# Patient Record
Sex: Female | Born: 1935 | Race: White | Hispanic: No | Smoking: Former smoker
Health system: Southern US, Community
[De-identification: ages and names within clinical notes are randomized; demographics above are authoritative.]

## PROBLEM LIST (undated history)

## (undated) DIAGNOSIS — I10 Essential (primary) hypertension: Secondary | ICD-10-CM

## (undated) DIAGNOSIS — J439 Emphysema, unspecified: Secondary | ICD-10-CM

## (undated) DIAGNOSIS — I714 Abdominal aortic aneurysm, without rupture, unspecified: Secondary | ICD-10-CM

## (undated) DIAGNOSIS — E785 Hyperlipidemia, unspecified: Secondary | ICD-10-CM

## (undated) DIAGNOSIS — E278 Other specified disorders of adrenal gland: Secondary | ICD-10-CM

## (undated) DIAGNOSIS — R0902 Hypoxemia: Secondary | ICD-10-CM

## (undated) DIAGNOSIS — J449 Chronic obstructive pulmonary disease, unspecified: Secondary | ICD-10-CM

## (undated) HISTORY — DX: Hyperlipidemia, unspecified: E78.5

## (undated) HISTORY — DX: Essential (primary) hypertension: I10

## (undated) HISTORY — PX: BLADDER SURGERY: SHX569

## (undated) HISTORY — DX: Other specified disorders of adrenal gland: E27.8

## (undated) HISTORY — DX: Hypoxemia: R09.02

## (undated) HISTORY — DX: Chronic obstructive pulmonary disease, unspecified: J44.9

## (undated) HISTORY — DX: Emphysema, unspecified: J43.9

## (undated) HISTORY — DX: Abdominal aortic aneurysm, without rupture, unspecified: I71.40

## (undated) HISTORY — DX: Abdominal aortic aneurysm, without rupture: I71.4

## (undated) HISTORY — PX: BLADDER TUMOR EXCISION: SHX238

---

## 2004-02-24 ENCOUNTER — Other Ambulatory Visit: Payer: Self-pay

## 2004-03-03 ENCOUNTER — Ambulatory Visit: Payer: Self-pay | Admitting: Urology

## 2004-03-06 ENCOUNTER — Ambulatory Visit: Payer: Self-pay | Admitting: Urology

## 2004-03-13 ENCOUNTER — Ambulatory Visit: Payer: Self-pay | Admitting: Urology

## 2004-03-18 ENCOUNTER — Ambulatory Visit: Payer: Self-pay | Admitting: Urology

## 2007-08-03 ENCOUNTER — Ambulatory Visit: Payer: Self-pay | Admitting: Family Medicine

## 2009-06-13 ENCOUNTER — Ambulatory Visit: Payer: Self-pay | Admitting: Family Medicine

## 2011-08-02 DIAGNOSIS — J40 Bronchitis, not specified as acute or chronic: Secondary | ICD-10-CM | POA: Diagnosis not present

## 2011-08-02 DIAGNOSIS — J449 Chronic obstructive pulmonary disease, unspecified: Secondary | ICD-10-CM | POA: Diagnosis not present

## 2011-11-30 ENCOUNTER — Observation Stay: Payer: Self-pay | Admitting: Internal Medicine

## 2011-11-30 DIAGNOSIS — Z79899 Other long term (current) drug therapy: Secondary | ICD-10-CM | POA: Diagnosis not present

## 2011-11-30 DIAGNOSIS — D72829 Elevated white blood cell count, unspecified: Secondary | ICD-10-CM | POA: Diagnosis not present

## 2011-11-30 DIAGNOSIS — I10 Essential (primary) hypertension: Secondary | ICD-10-CM | POA: Diagnosis not present

## 2011-11-30 DIAGNOSIS — Z7982 Long term (current) use of aspirin: Secondary | ICD-10-CM | POA: Diagnosis not present

## 2011-11-30 DIAGNOSIS — R0902 Hypoxemia: Secondary | ICD-10-CM | POA: Diagnosis not present

## 2011-11-30 DIAGNOSIS — Z91013 Allergy to seafood: Secondary | ICD-10-CM | POA: Diagnosis not present

## 2011-11-30 DIAGNOSIS — E669 Obesity, unspecified: Secondary | ICD-10-CM | POA: Diagnosis not present

## 2011-11-30 DIAGNOSIS — K8020 Calculus of gallbladder without cholecystitis without obstruction: Secondary | ICD-10-CM | POA: Diagnosis not present

## 2011-11-30 DIAGNOSIS — R42 Dizziness and giddiness: Secondary | ICD-10-CM | POA: Diagnosis not present

## 2011-11-30 DIAGNOSIS — Z880 Allergy status to penicillin: Secondary | ICD-10-CM | POA: Diagnosis not present

## 2011-11-30 DIAGNOSIS — E785 Hyperlipidemia, unspecified: Secondary | ICD-10-CM | POA: Diagnosis not present

## 2011-11-30 DIAGNOSIS — J449 Chronic obstructive pulmonary disease, unspecified: Secondary | ICD-10-CM | POA: Diagnosis not present

## 2011-11-30 DIAGNOSIS — R0989 Other specified symptoms and signs involving the circulatory and respiratory systems: Secondary | ICD-10-CM | POA: Diagnosis not present

## 2011-11-30 DIAGNOSIS — I658 Occlusion and stenosis of other precerebral arteries: Secondary | ICD-10-CM | POA: Diagnosis not present

## 2011-11-30 DIAGNOSIS — F17200 Nicotine dependence, unspecified, uncomplicated: Secondary | ICD-10-CM | POA: Diagnosis not present

## 2011-11-30 DIAGNOSIS — R6889 Other general symptoms and signs: Secondary | ICD-10-CM | POA: Diagnosis not present

## 2011-11-30 DIAGNOSIS — R05 Cough: Secondary | ICD-10-CM | POA: Diagnosis not present

## 2011-11-30 LAB — URINALYSIS, COMPLETE
Blood: NEGATIVE
Glucose,UR: NEGATIVE mg/dL (ref 0–75)
Hyaline Cast: 1
RBC,UR: 1 /HPF (ref 0–5)
Specific Gravity: 1.025 (ref 1.003–1.030)
Squamous Epithelial: <1
WBC UR: 1 /HPF (ref 0–5)

## 2011-11-30 LAB — CBC
HCT: 48.2 % — ABNORMAL HIGH (ref 35.0–47.0)
HGB: 15.7 g/dL (ref 12.0–16.0)
MCH: 29.9 pg (ref 26.0–34.0)
MCHC: 32.5 g/dL (ref 32.0–36.0)
MCV: 92 fL (ref 80–100)
RDW: 13.3 % (ref 11.5–14.5)

## 2011-11-30 LAB — COMPREHENSIVE METABOLIC PANEL
Alkaline Phosphatase: 113 U/L (ref 50–136)
BUN: 15 mg/dL (ref 7–18)
Bilirubin,Total: 0.4 mg/dL (ref 0.2–1.0)
Calcium, Total: 9 mg/dL (ref 8.5–10.1)
Chloride: 101 mmol/L (ref 98–107)
Co2: 27 mmol/L (ref 21–32)
EGFR (African American): >60
EGFR (Non-African Amer.): >60
Glucose: 145 mg/dL — ABNORMAL HIGH (ref 65–99)
SGOT(AST): 27 U/L (ref 15–37)
Sodium: 138 mmol/L (ref 136–145)

## 2011-11-30 LAB — TROPONIN I: Troponin-I: <0.02 ng/mL

## 2011-11-30 LAB — PRO B NATRIURETIC PEPTIDE: B-Type Natriuretic Peptide: 120 pg/mL (ref 0–450)

## 2011-11-30 LAB — CK TOTAL AND CKMB (NOT AT ARMC): CK, Total: 67 U/L (ref 21–215)

## 2011-12-01 DIAGNOSIS — I6529 Occlusion and stenosis of unspecified carotid artery: Secondary | ICD-10-CM | POA: Diagnosis not present

## 2011-12-01 DIAGNOSIS — I10 Essential (primary) hypertension: Secondary | ICD-10-CM | POA: Diagnosis not present

## 2011-12-01 DIAGNOSIS — J449 Chronic obstructive pulmonary disease, unspecified: Secondary | ICD-10-CM | POA: Diagnosis not present

## 2011-12-01 DIAGNOSIS — R42 Dizziness and giddiness: Secondary | ICD-10-CM | POA: Diagnosis not present

## 2011-12-01 DIAGNOSIS — I658 Occlusion and stenosis of other precerebral arteries: Secondary | ICD-10-CM | POA: Diagnosis not present

## 2011-12-01 DIAGNOSIS — E785 Hyperlipidemia, unspecified: Secondary | ICD-10-CM | POA: Diagnosis not present

## 2011-12-01 LAB — CBC WITH DIFFERENTIAL/PLATELET
Basophil #: 0 10*3/uL (ref 0.0–0.1)
Basophil %: 0.2 %
HCT: 44.2 % (ref 35.0–47.0)
HGB: 14.5 g/dL (ref 12.0–16.0)
Lymphocyte %: 7 %
Monocyte %: 0.6 %
Neutrophil #: 14.2 10*3/uL — ABNORMAL HIGH (ref 1.4–6.5)
Neutrophil %: 91.6 %
RBC: 4.78 10*6/uL (ref 3.80–5.20)
RDW: 13.3 % (ref 11.5–14.5)
WBC: 15.5 10*3/uL — ABNORMAL HIGH (ref 3.6–11.0)

## 2011-12-01 LAB — LIPID PANEL
Cholesterol: 225 mg/dL — ABNORMAL HIGH (ref 0–200)
HDL Cholesterol: 29 mg/dL — ABNORMAL LOW (ref 40–60)
Triglycerides: 230 mg/dL — ABNORMAL HIGH (ref 0–200)
VLDL Cholesterol, Calc: 46 mg/dL — ABNORMAL HIGH (ref 5–40)

## 2011-12-07 DIAGNOSIS — E119 Type 2 diabetes mellitus without complications: Secondary | ICD-10-CM | POA: Diagnosis not present

## 2011-12-07 DIAGNOSIS — M79609 Pain in unspecified limb: Secondary | ICD-10-CM | POA: Diagnosis not present

## 2011-12-07 DIAGNOSIS — I70219 Atherosclerosis of native arteries of extremities with intermittent claudication, unspecified extremity: Secondary | ICD-10-CM | POA: Diagnosis not present

## 2011-12-07 DIAGNOSIS — I6529 Occlusion and stenosis of unspecified carotid artery: Secondary | ICD-10-CM | POA: Diagnosis not present

## 2011-12-09 DIAGNOSIS — J449 Chronic obstructive pulmonary disease, unspecified: Secondary | ICD-10-CM | POA: Diagnosis not present

## 2011-12-09 DIAGNOSIS — I10 Essential (primary) hypertension: Secondary | ICD-10-CM | POA: Diagnosis not present

## 2011-12-09 DIAGNOSIS — E785 Hyperlipidemia, unspecified: Secondary | ICD-10-CM | POA: Diagnosis not present

## 2012-02-23 DIAGNOSIS — E785 Hyperlipidemia, unspecified: Secondary | ICD-10-CM | POA: Diagnosis not present

## 2012-03-10 DIAGNOSIS — R209 Unspecified disturbances of skin sensation: Secondary | ICD-10-CM | POA: Diagnosis not present

## 2012-03-10 DIAGNOSIS — I6529 Occlusion and stenosis of unspecified carotid artery: Secondary | ICD-10-CM | POA: Diagnosis not present

## 2012-03-10 DIAGNOSIS — E669 Obesity, unspecified: Secondary | ICD-10-CM | POA: Diagnosis not present

## 2012-03-10 DIAGNOSIS — I10 Essential (primary) hypertension: Secondary | ICD-10-CM | POA: Diagnosis not present

## 2012-03-10 DIAGNOSIS — M79609 Pain in unspecified limb: Secondary | ICD-10-CM | POA: Diagnosis not present

## 2012-06-12 DIAGNOSIS — R42 Dizziness and giddiness: Secondary | ICD-10-CM | POA: Diagnosis not present

## 2012-06-12 DIAGNOSIS — I10 Essential (primary) hypertension: Secondary | ICD-10-CM | POA: Diagnosis not present

## 2012-06-12 DIAGNOSIS — E785 Hyperlipidemia, unspecified: Secondary | ICD-10-CM | POA: Diagnosis not present

## 2012-06-12 DIAGNOSIS — J449 Chronic obstructive pulmonary disease, unspecified: Secondary | ICD-10-CM | POA: Diagnosis not present

## 2012-08-29 DIAGNOSIS — L905 Scar conditions and fibrosis of skin: Secondary | ICD-10-CM | POA: Diagnosis not present

## 2012-08-29 DIAGNOSIS — Z1283 Encounter for screening for malignant neoplasm of skin: Secondary | ICD-10-CM | POA: Diagnosis not present

## 2012-08-29 DIAGNOSIS — D485 Neoplasm of uncertain behavior of skin: Secondary | ICD-10-CM | POA: Diagnosis not present

## 2012-08-29 DIAGNOSIS — L821 Other seborrheic keratosis: Secondary | ICD-10-CM | POA: Diagnosis not present

## 2012-08-29 DIAGNOSIS — L719 Rosacea, unspecified: Secondary | ICD-10-CM | POA: Diagnosis not present

## 2012-08-29 DIAGNOSIS — C44319 Basal cell carcinoma of skin of other parts of face: Secondary | ICD-10-CM | POA: Diagnosis not present

## 2012-09-13 DIAGNOSIS — C44319 Basal cell carcinoma of skin of other parts of face: Secondary | ICD-10-CM | POA: Diagnosis not present

## 2012-11-27 DIAGNOSIS — Z85828 Personal history of other malignant neoplasm of skin: Secondary | ICD-10-CM | POA: Diagnosis not present

## 2012-11-27 DIAGNOSIS — L905 Scar conditions and fibrosis of skin: Secondary | ICD-10-CM | POA: Diagnosis not present

## 2013-01-09 DIAGNOSIS — J3089 Other allergic rhinitis: Secondary | ICD-10-CM | POA: Diagnosis not present

## 2013-01-09 DIAGNOSIS — I10 Essential (primary) hypertension: Secondary | ICD-10-CM | POA: Diagnosis not present

## 2013-01-09 DIAGNOSIS — E785 Hyperlipidemia, unspecified: Secondary | ICD-10-CM | POA: Diagnosis not present

## 2013-01-09 DIAGNOSIS — J449 Chronic obstructive pulmonary disease, unspecified: Secondary | ICD-10-CM | POA: Diagnosis not present

## 2013-08-29 DIAGNOSIS — L738 Other specified follicular disorders: Secondary | ICD-10-CM | POA: Diagnosis not present

## 2013-08-29 DIAGNOSIS — D239 Other benign neoplasm of skin, unspecified: Secondary | ICD-10-CM | POA: Diagnosis not present

## 2013-08-29 DIAGNOSIS — Z1283 Encounter for screening for malignant neoplasm of skin: Secondary | ICD-10-CM | POA: Diagnosis not present

## 2013-08-29 DIAGNOSIS — Z85828 Personal history of other malignant neoplasm of skin: Secondary | ICD-10-CM | POA: Diagnosis not present

## 2013-09-10 DIAGNOSIS — J449 Chronic obstructive pulmonary disease, unspecified: Secondary | ICD-10-CM | POA: Diagnosis not present

## 2013-09-10 DIAGNOSIS — I10 Essential (primary) hypertension: Secondary | ICD-10-CM | POA: Diagnosis not present

## 2013-09-10 DIAGNOSIS — E785 Hyperlipidemia, unspecified: Secondary | ICD-10-CM | POA: Diagnosis not present

## 2014-06-12 DIAGNOSIS — E784 Other hyperlipidemia: Secondary | ICD-10-CM | POA: Diagnosis not present

## 2014-06-12 DIAGNOSIS — J449 Chronic obstructive pulmonary disease, unspecified: Secondary | ICD-10-CM | POA: Diagnosis not present

## 2014-06-12 DIAGNOSIS — I10 Essential (primary) hypertension: Secondary | ICD-10-CM | POA: Diagnosis not present

## 2014-06-12 DIAGNOSIS — R69 Illness, unspecified: Secondary | ICD-10-CM | POA: Diagnosis not present

## 2014-09-11 DIAGNOSIS — Z1283 Encounter for screening for malignant neoplasm of skin: Secondary | ICD-10-CM | POA: Diagnosis not present

## 2014-09-11 DIAGNOSIS — L821 Other seborrheic keratosis: Secondary | ICD-10-CM | POA: Diagnosis not present

## 2014-09-11 DIAGNOSIS — Z08 Encounter for follow-up examination after completed treatment for malignant neoplasm: Secondary | ICD-10-CM | POA: Diagnosis not present

## 2014-09-11 DIAGNOSIS — Z85828 Personal history of other malignant neoplasm of skin: Secondary | ICD-10-CM | POA: Diagnosis not present

## 2014-09-22 NOTE — Discharge Summary (Signed)
PATIENT NAME:  LOY, LITTLE MR#:  [id number] DATE OF BIRTH:  06/15/1930  DATE OF ADMISSION:  11/30/2011 DATE OF DISCHARGE:  12/01/2011  ADMITTING DIAGNOSIS: Dizziness/vertigo.  DISCHARGE DIAGNOSES:  1. Vertigo.  2. Hypertension.  3. Hyperlipidemia.  4. Right carotid stenosis. 5. Chronic respiratory failure secondary to chronic obstructive pulmonary disease with ongoing tobacco abuse.  6. Incidental finding of cholelithiasis.  CODE STATUS: FULL CODE.   DISCHARGE MEDICATIONS:  1. Hydrochlorothiazide/losartan 12.5/50 mg one p.o. daily.  2. Spiriva 18 mcg inhalation daily.  3. Aspirin 81 mg daily.  4. Loratadine 10 mg daily.  5. Meclizine 25 mg three times daily as needed.   DISCHARGE INSTRUCTIONS/FOLLOWUP: Followup with Dr. Lucky Cowboy on 12/07/2011 at 8:45. Followup with Dr. Otilio Miu in 1 to 2 weeks.  LABORATORY, DIAGNOSTIC AND RADIOLOGIC DATA: Carotid Doppler showed calcified plaque bilaterally and somewhat more visible on the right, at the carotid bulb, than on the left. The wave flow patterns, velocity, and ratios do not suggest high grade stenosis, but visually on the right the degree of stenosis could be as much as 75%. Follow up with CT angiography would be useful.   Hemoglobin A1c 6.1. Cholesterol 225, triglycerides 230, LDL 150, and HDL 29. White count 15.5, hemoglobin and hematocrit 14.5 and 44.5, and platelet count 283.   CT of the head without contrast shows no acute intracranial process.   CT of the chest shows there is left basilar air space disease which may represent atelectasis. Left adenoma. Cholelithiasis.  Urinalysis negative for urinary tract infection.   Comprehensive metabolic panel within normal limits. Cardiac enzymes negative. B-type natriuretic peptide 120.  BRIEF SUMMARY OF HOSPITAL COURSE: Ms. Babineau is a 81 year old morbidly obese Caucasian female with the above medical problems who comes to the Emergency Room with:  1. Acute on chronic vertigo. The  patient had been to the beauty shop and after her hair was done, when the patient's hairdresser turned the patient's head around and she was spinning around, which aggravated her chronic vertigo. She felt nauseated, had vomiting x1, and came to the Emergency Room. She was not able to get her balance straightened out at home. She was given meclizine around-the-clock, symptomatically much better. Physical therapy evaluated the patient and no recommends at home.  2. Leukocytosis, appears reactive. No source of infection.  3. Chronic obstructive pulmonary disease. Appears chronic with chronic hypoxic respiratory failure. She remained stable. The patient has ongoing tobacco abuse. She is not motivated to quit smoking. Her saturations normally run in the 80s. The patient is aware of her saturations and so is her son. Since the patient was not motivated on giving up smoking, which she does on a regular basis, I was not comfortable for her to be set up with oxygen. The patient also did not want oxygen to go home with. 4. Hypertension, stable.  5. Hyperlipidemia. The patient has a known abnormal lipid profile for many years. Discussed starting p.o. statins, however, she wants to discuss it with her PCP.  6. Right carotid stenosis of about 75%. The patient is asymptomatic. This was noted in routine workup and on carotid Doppler. The patient will follow up with vascular as an outpatient, with Dr. Lucky Cowboy.  7. Incidental finding of cholelithiasis.         Overall the patient's hospital stay remained stable. The patient remained a FULL CODE.   TIME SPENT: 40 minutes.  ____________________________ Hart Rochester Posey Pronto, MD sap:slb D: 12/02/2011 14:36:45 ET T: 12/03/2011 14:14:51 ET JOB#:  [id number]  cc: Amairany Schumpert A. Posey Pronto, MD, <Dictator> Algernon Huxley, MD Juline Patch, MD Ilda Basset MD ELECTRONICALLY SIGNED 12/03/2011 17:13

## 2014-09-22 NOTE — H&P (Signed)
PATIENT NAME:  Brittany Parks, Brittany Parks MR#:  [id number] DATE OF BIRTH:  1930-07-06  DATE OF ADMISSION:  11/30/2011  PRIMARY CARE PHYSICIAN: Otilio Miu, MD    REFERRING PHYSICIAN: Dr. Prince Rome   CHIEF COMPLAINT: Dizziness today.   HISTORY OF PRESENT ILLNESS: The patient is a 81 year old Caucasian female with a history of hypertension, hyperlipidemia, smoking, and sinus problems who presented to the ED with the above chief complaint. The patient is awake, oriented in no acute distress. The patient said she developed severe dizziness today. She feels room is spinning around which is persistent, exacerbated by changing position on the bed. In addition, the patient had nausea, vomiting, mild shortness of breath and cough but she denies any chest pain, palpitation, orthopnea, or nocturnal dyspnea. No weakness, syncope, loss of consciousness but has mild headache. She was admitted for vertigo by Dr. Prince Rome.   PAST MEDICAL HISTORY: As mentioned above:  1. Hypertension. 2. Hyperlipidemia. 3. Smoking. 4. Sinus problem.   SOCIAL HISTORY: The patient has been smoking half pack a day for 60 years. No alcohol drinking or illicit drugs.   PAST SURGICAL HISTORY: None.   FAMILY HISTORY: No hypertension, diabetes, heart attack, or stroke.   MEDICATIONS:  1. Aspirin 81 mg p.o. daily.  2. Spiriva 18 mcg inhalation. 3. Percocet 5/325 mg p.o. q.6 hours p.r.n.  4. HCTZ/losartan 12.5/50 mg p.o. daily.   REVIEW OF SYSTEMS: CONSTITUTIONAL: The patient denies any fever, chills. No headache but has dizziness, mild weakness. EYES: No double vision or blurred vision. ENT: No epistaxis, postnasal drip, slurred speech, or dysphagia. CARDIOVASCULAR: No chest pain, palpitation, orthopnea, or nocturnal dyspnea. No leg edema. PULMONARY: Shortness of breath, cough but no wheezing or hemoptysis. GI: No abdominal pain but has nausea and vomiting. No diarrhea. No bloody stool or melena. GU: No dysuria, hematuria, or incontinence.  SKIN: No rash or jaundice. HEMATOLOGY: No easy bruising or bleeding. NEUROLOGY: No syncope, loss of consciousness, or seizure but has a headache and vertigo.   PHYSICAL EXAMINATION:   VITAL SIGNS: Temperature 96.3, blood pressure 147/68, pulse 91, respirations 20, oxygen saturation 97% on room air.   GENERAL: The patient is alert, awake, oriented in no acute distress.   HEENT: Pupils round, equal, reactive to light and accommodation. Moist oral mucosa. Clear oropharynx.   NECK: Supple. No JVD or carotid bruits. No lymphadenopathy. No thyromegaly.   CARDIOVASCULAR: S1, S2 regular rate and rhythm. No murmurs or gallops.   PULMONARY: The patient has very weak lung sounds bilaterally. Mild wheezing.    ABDOMEN: Soft. No distention or tenderness. No organomegaly. Bowel sounds present. Obese.   EXTREMITIES: No edema, clubbing, or cyanosis. No calf tenderness. Strong bilateral pedal pulses.    SKIN: No rash or jaundice.   NEUROLOGY: Alert and oriented x3. No focal deficit. Deep tendon reflexes 2+. Power 5 out of 5. Sensation intact.   LABORATORY, DIAGNOSTIC, AND RADIOLOGICAL DATA: CAT scan of the chest showed left basilar airspace disease, left adrenal adenoma, and cholelithiasis.   EKG showed normal sinus rhythm, possible left atrial enlargement, prolonged QT.   Urinalysis negative.  Chest x-ray is negative.  White count 14.1, hemoglobin 15.7, glucose 145, BUN 15, creatinine 0.79. Electrolytes normal. Troponin less than 0.02. BNP 120. CK 67.   IMPRESSION:  1. Vertigo, unknown etiology.  2. Leukocytosis possibly due to reaction.  3. Chronic obstructive pulmonary disease.  4. Hypertension, uncontrolled.  5. Hyperlipidemia.  6. Obesity.  7. Tobacco use. 8. Left adrenal adenoma. 9. Cholelithiasis.  PLAN OF TREATMENT:  1. The patient will be placed for observation.  2. We will check lipid panel, TSH, hemoglobin A1c.  3. Will get a CAT scan of head and a carotid duplex.  4. Will  give Antivert, aspirin, and Zocor.  5. Will continue HCTZ/losartan.  6. Will continue Spiriva and DuoNebs p.r.n.  7. Follow-up CBC.  8. Smoking cessation. Was counseled.   Discussed the patient's situation and plan of treatment with the patient and the patient's son.   TIME SPENT: About 60 minutes.   ____________________________ Demetrios Loll, MD qc:drc D: 11/30/2011 21:19:27 ET T: 12/01/2011 05:54:35 ET JOB#: [id number] cc: Demetrios Loll, MD, <Dictator>, Juline Patch, MD Demetrios Loll MD ELECTRONICALLY SIGNED 12/01/2011 14:40

## 2014-12-05 ENCOUNTER — Other Ambulatory Visit: Payer: Self-pay

## 2014-12-05 DIAGNOSIS — I10 Essential (primary) hypertension: Secondary | ICD-10-CM | POA: Insufficient documentation

## 2014-12-05 DIAGNOSIS — J441 Chronic obstructive pulmonary disease with (acute) exacerbation: Secondary | ICD-10-CM | POA: Insufficient documentation

## 2014-12-05 DIAGNOSIS — E785 Hyperlipidemia, unspecified: Secondary | ICD-10-CM | POA: Insufficient documentation

## 2014-12-05 MED ORDER — UMECLIDINIUM-VILANTEROL 62.5-25 MCG/INH IN AEPB: 1.0000 | INHALATION_SPRAY | Freq: Every day | RESPIRATORY_TRACT | Status: DC

## 2014-12-05 MED ORDER — LOSARTAN POTASSIUM-HCTZ 50-12.5 MG PO TABS
1.0000 | ORAL_TABLET | Freq: Every day | ORAL | Status: DC
Start: 2014-12-05 — End: 2015-02-21

## 2014-12-05 MED ORDER — ATORVASTATIN CALCIUM 10 MG PO TABS: 10.0000 mg | ORAL_TABLET | Freq: Every day | ORAL | Status: DC

## 2015-01-13 ENCOUNTER — Encounter: Payer: Self-pay | Admitting: Family Medicine

## 2015-01-13 ENCOUNTER — Ambulatory Visit (INDEPENDENT_AMBULATORY_CARE_PROVIDER_SITE_OTHER): Payer: Medicare Other | Admitting: Family Medicine

## 2015-01-13 VITALS — BP 130/78 | HR 64 | Ht 62.0 in | Wt 190.0 lb

## 2015-01-13 DIAGNOSIS — R310 Gross hematuria: Secondary | ICD-10-CM

## 2015-01-13 MED ORDER — SULFAMETHOXAZOLE-TRIMETHOPRIM 800-160 MG PO TABS: 1.0000 | ORAL_TABLET | Freq: Two times a day (BID) | ORAL | Status: DC

## 2015-01-13 NOTE — Progress Notes (Signed)
Name: Brittany Parks   MRN: [id number]    DOB: 07-Oct-1930   Date:01/13/2015       Progress Note  Subjective  Chief Complaint  Chief Complaint  Patient presents with  . Urinary Tract Infection    passing clots and lower back pain    Urinary Tract Infection  This is a recurrent (hematuria) problem. The current episode started in the past 7 days. The problem occurs every urination (hematuria). The problem has been waxing and waning. Associated symptoms include flank pain, frequency and hematuria. Pertinent negatives include no chills, hesitancy, nausea, urgency or vomiting.  Hematuria This is a recurrent problem. The current episode started in the past 7 days. The problem has been waxing and waning since onset. She describes the hematuria as gross hematuria. The hematuria occurs throughout her entire urinary stream. She reports clotting at the beginning of her urine stream. She is experiencing no pain. She describes her urine color as dark red (wanes to pink). Irritative symptoms include frequency. Irritative symptoms do not include nocturia or urgency. Obstructive symptoms include a weak stream. Obstructive symptoms do not include incomplete emptying, an intermittent stream, a slower stream or straining. Associated symptoms include flank pain and inability to urinate. Pertinent negatives include no abdominal pain, chills, dysuria, facial swelling, fever, genital pain, hesitancy, nausea or vomiting. Her past medical history is significant for tobacco use. There is no history of GU trauma.    No problem-specific assessment & plan notes found for this encounter.   Past Medical History  Diagnosis Date  . COPD (chronic obstructive pulmonary disease)   . Hyperlipidemia   . Hypertension     History reviewed. No pertinent past surgical history.  History reviewed. No pertinent family history.  Social History   Social History  . Marital Status: Widowed    Spouse Name: N/A  . Number of  Children: N/A  . Years of Education: N/A   Occupational History  . Not on file.   Social History Main Topics  . Smoking status: Former Research scientist (life sciences)  . Smokeless tobacco: Not on file  . Alcohol Use: No  . Drug Use: No  . Sexual Activity: No   Other Topics Concern  . Not on file   Social History Narrative  . No narrative on file    Allergies  Allergen Reactions  . Penicillins      Review of Systems  Constitutional: Negative for fever, chills, weight loss and malaise/fatigue.  HENT: Negative for ear discharge, ear pain, facial swelling and sore throat.   Eyes: Negative for blurred vision.  Respiratory: Negative for cough, sputum production, shortness of breath and wheezing.   Cardiovascular: Negative for chest pain, palpitations and leg swelling.  Gastrointestinal: Negative for heartburn, nausea, vomiting, abdominal pain, diarrhea, constipation, blood in stool and melena.  Genitourinary: Positive for frequency, hematuria and flank pain. Negative for dysuria, hesitancy, urgency, incomplete emptying and nocturia.  Musculoskeletal: Negative for myalgias, back pain, joint pain and neck pain.  Skin: Negative for rash.  Neurological: Negative for dizziness, tingling, sensory change, focal weakness and headaches.  Endo/Heme/Allergies: Negative for environmental allergies and polydipsia. Does not bruise/bleed easily.  Psychiatric/Behavioral: Negative for depression and suicidal ideas. The patient is not nervous/anxious and does not have insomnia.      Objective  Filed Vitals:   01/13/15 1554  BP: 130/78  Pulse: 64  Height: 5\' 2"  (1.575 m)  Weight: 190 lb (86.183 kg)    Physical Exam  Constitutional: She is well-developed, well-nourished, and  in no distress. No distress.  HENT:  Head: Normocephalic and atraumatic.  Right Ear: External ear normal.  Left Ear: External ear normal.  Nose: Nose normal.  Mouth/Throat: Oropharynx is clear and moist.  Eyes: Conjunctivae and EOM are  normal. Pupils are equal, round, and reactive to light. Right eye exhibits no discharge. Left eye exhibits no discharge.  Neck: Normal range of motion. Neck supple. No JVD present. No thyromegaly present.  Cardiovascular: Normal rate, regular rhythm, normal heart sounds and intact distal pulses.  Exam reveals no gallop and no friction rub.   No murmur heard. Pulmonary/Chest: Effort normal and breath sounds normal.  Abdominal: Soft. Bowel sounds are normal. She exhibits no mass. There is no tenderness. There is no guarding.  Musculoskeletal: Normal range of motion. She exhibits no edema.  Lymphadenopathy:    She has no cervical adenopathy.  Neurological: She is alert. She has normal reflexes.  Skin: Skin is warm and dry. She is not diaphoretic.  Psychiatric: Mood and affect normal.  Nursing note and vitals reviewed.     Assessment & Plan  Problem List Items Addressed This Visit    None        Dr. Otilio Miu [facility]  01/13/2015

## 2015-01-14 DIAGNOSIS — R310 Gross hematuria: Secondary | ICD-10-CM | POA: Diagnosis not present

## 2015-01-14 DIAGNOSIS — R39198 Other difficulties with micturition: Secondary | ICD-10-CM | POA: Insufficient documentation

## 2015-01-14 DIAGNOSIS — R3919 Other difficulties with micturition: Secondary | ICD-10-CM | POA: Diagnosis not present

## 2015-01-14 DIAGNOSIS — R339 Retention of urine, unspecified: Secondary | ICD-10-CM | POA: Diagnosis not present

## 2015-01-14 DIAGNOSIS — Z87442 Personal history of urinary calculi: Secondary | ICD-10-CM | POA: Diagnosis not present

## 2015-01-15 DIAGNOSIS — R339 Retention of urine, unspecified: Secondary | ICD-10-CM | POA: Diagnosis not present

## 2015-01-23 DIAGNOSIS — D175 Benign lipomatous neoplasm of intra-abdominal organs: Secondary | ICD-10-CM | POA: Diagnosis not present

## 2015-01-23 DIAGNOSIS — N3289 Other specified disorders of bladder: Secondary | ICD-10-CM | POA: Diagnosis not present

## 2015-01-23 DIAGNOSIS — K8020 Calculus of gallbladder without cholecystitis without obstruction: Secondary | ICD-10-CM | POA: Diagnosis not present

## 2015-01-23 DIAGNOSIS — E279 Disorder of adrenal gland, unspecified: Secondary | ICD-10-CM | POA: Diagnosis not present

## 2015-01-23 DIAGNOSIS — Z79899 Other long term (current) drug therapy: Secondary | ICD-10-CM | POA: Diagnosis not present

## 2015-01-23 DIAGNOSIS — Z87891 Personal history of nicotine dependence: Secondary | ICD-10-CM | POA: Diagnosis not present

## 2015-01-23 DIAGNOSIS — J449 Chronic obstructive pulmonary disease, unspecified: Secondary | ICD-10-CM | POA: Diagnosis not present

## 2015-01-23 DIAGNOSIS — I714 Abdominal aortic aneurysm, without rupture, unspecified: Secondary | ICD-10-CM | POA: Insufficient documentation

## 2015-01-23 DIAGNOSIS — Z7982 Long term (current) use of aspirin: Secondary | ICD-10-CM | POA: Diagnosis not present

## 2015-01-23 DIAGNOSIS — N200 Calculus of kidney: Secondary | ICD-10-CM | POA: Diagnosis not present

## 2015-01-23 DIAGNOSIS — N281 Cyst of kidney, acquired: Secondary | ICD-10-CM | POA: Diagnosis not present

## 2015-01-23 DIAGNOSIS — R934 Abnormal findings on diagnostic imaging of urinary organs: Secondary | ICD-10-CM | POA: Diagnosis not present

## 2015-01-23 DIAGNOSIS — M47819 Spondylosis without myelopathy or radiculopathy, site unspecified: Secondary | ICD-10-CM | POA: Diagnosis not present

## 2015-01-23 DIAGNOSIS — D3502 Benign neoplasm of left adrenal gland: Secondary | ICD-10-CM | POA: Diagnosis not present

## 2015-01-23 DIAGNOSIS — N858 Other specified noninflammatory disorders of uterus: Secondary | ICD-10-CM | POA: Diagnosis not present

## 2015-01-23 DIAGNOSIS — K5730 Diverticulosis of large intestine without perforation or abscess without bleeding: Secondary | ICD-10-CM | POA: Diagnosis not present

## 2015-01-23 DIAGNOSIS — D414 Neoplasm of uncertain behavior of bladder: Secondary | ICD-10-CM | POA: Diagnosis not present

## 2015-01-23 DIAGNOSIS — E278 Other specified disorders of adrenal gland: Secondary | ICD-10-CM | POA: Insufficient documentation

## 2015-01-23 DIAGNOSIS — R599 Enlarged lymph nodes, unspecified: Secondary | ICD-10-CM | POA: Diagnosis not present

## 2015-01-23 DIAGNOSIS — R590 Localized enlarged lymph nodes: Secondary | ICD-10-CM | POA: Insufficient documentation

## 2015-01-23 DIAGNOSIS — N261 Atrophy of kidney (terminal): Secondary | ICD-10-CM | POA: Diagnosis not present

## 2015-01-23 DIAGNOSIS — K868 Other specified diseases of pancreas: Secondary | ICD-10-CM | POA: Diagnosis not present

## 2015-01-23 DIAGNOSIS — R93429 Abnormal radiologic findings on diagnostic imaging of unspecified kidney: Secondary | ICD-10-CM | POA: Insufficient documentation

## 2015-01-23 DIAGNOSIS — Z880 Allergy status to penicillin: Secondary | ICD-10-CM | POA: Diagnosis not present

## 2015-01-23 DIAGNOSIS — R310 Gross hematuria: Secondary | ICD-10-CM | POA: Diagnosis not present

## 2015-01-24 DIAGNOSIS — C679 Malignant neoplasm of bladder, unspecified: Secondary | ICD-10-CM | POA: Diagnosis not present

## 2015-01-24 DIAGNOSIS — E785 Hyperlipidemia, unspecified: Secondary | ICD-10-CM | POA: Diagnosis not present

## 2015-01-24 DIAGNOSIS — I10 Essential (primary) hypertension: Secondary | ICD-10-CM | POA: Diagnosis not present

## 2015-01-24 DIAGNOSIS — R934 Abnormal findings on diagnostic imaging of urinary organs: Secondary | ICD-10-CM | POA: Diagnosis not present

## 2015-01-24 DIAGNOSIS — Z01818 Encounter for other preprocedural examination: Secondary | ICD-10-CM | POA: Diagnosis not present

## 2015-01-24 DIAGNOSIS — D414 Neoplasm of uncertain behavior of bladder: Secondary | ICD-10-CM | POA: Diagnosis not present

## 2015-01-24 DIAGNOSIS — R310 Gross hematuria: Secondary | ICD-10-CM | POA: Diagnosis not present

## 2015-01-24 DIAGNOSIS — J449 Chronic obstructive pulmonary disease, unspecified: Secondary | ICD-10-CM | POA: Diagnosis not present

## 2015-01-24 DIAGNOSIS — D489 Neoplasm of uncertain behavior, unspecified: Secondary | ICD-10-CM | POA: Diagnosis not present

## 2015-01-27 DIAGNOSIS — R310 Gross hematuria: Secondary | ICD-10-CM | POA: Diagnosis not present

## 2015-01-27 DIAGNOSIS — K5790 Diverticulosis of intestine, part unspecified, without perforation or abscess without bleeding: Secondary | ICD-10-CM | POA: Diagnosis not present

## 2015-01-27 DIAGNOSIS — E785 Hyperlipidemia, unspecified: Secondary | ICD-10-CM | POA: Diagnosis not present

## 2015-01-27 DIAGNOSIS — N281 Cyst of kidney, acquired: Secondary | ICD-10-CM | POA: Diagnosis not present

## 2015-01-27 DIAGNOSIS — D090 Carcinoma in situ of bladder: Secondary | ICD-10-CM | POA: Diagnosis not present

## 2015-01-27 DIAGNOSIS — D494 Neoplasm of unspecified behavior of bladder: Secondary | ICD-10-CM | POA: Diagnosis not present

## 2015-01-27 DIAGNOSIS — E278 Other specified disorders of adrenal gland: Secondary | ICD-10-CM | POA: Diagnosis not present

## 2015-01-27 DIAGNOSIS — Z880 Allergy status to penicillin: Secondary | ICD-10-CM | POA: Diagnosis not present

## 2015-01-27 DIAGNOSIS — C801 Malignant (primary) neoplasm, unspecified: Secondary | ICD-10-CM | POA: Diagnosis not present

## 2015-01-27 DIAGNOSIS — I714 Abdominal aortic aneurysm, without rupture: Secondary | ICD-10-CM | POA: Diagnosis not present

## 2015-01-27 DIAGNOSIS — Z7982 Long term (current) use of aspirin: Secondary | ICD-10-CM | POA: Diagnosis not present

## 2015-01-27 DIAGNOSIS — Z87891 Personal history of nicotine dependence: Secondary | ICD-10-CM | POA: Diagnosis not present

## 2015-01-27 DIAGNOSIS — I10 Essential (primary) hypertension: Secondary | ICD-10-CM | POA: Diagnosis not present

## 2015-01-27 DIAGNOSIS — Z87442 Personal history of urinary calculi: Secondary | ICD-10-CM | POA: Diagnosis not present

## 2015-01-27 DIAGNOSIS — K8020 Calculus of gallbladder without cholecystitis without obstruction: Secondary | ICD-10-CM | POA: Diagnosis not present

## 2015-01-27 DIAGNOSIS — Z79899 Other long term (current) drug therapy: Secondary | ICD-10-CM | POA: Diagnosis not present

## 2015-01-27 DIAGNOSIS — R935 Abnormal findings on diagnostic imaging of other abdominal regions, including retroperitoneum: Secondary | ICD-10-CM | POA: Diagnosis not present

## 2015-01-27 DIAGNOSIS — N200 Calculus of kidney: Secondary | ICD-10-CM | POA: Diagnosis not present

## 2015-01-27 DIAGNOSIS — J449 Chronic obstructive pulmonary disease, unspecified: Secondary | ICD-10-CM | POA: Diagnosis not present

## 2015-02-05 DIAGNOSIS — C673 Malignant neoplasm of anterior wall of bladder: Secondary | ICD-10-CM | POA: Diagnosis not present

## 2015-02-05 DIAGNOSIS — C678 Malignant neoplasm of overlapping sites of bladder: Secondary | ICD-10-CM | POA: Insufficient documentation

## 2015-02-05 DIAGNOSIS — R339 Retention of urine, unspecified: Secondary | ICD-10-CM | POA: Diagnosis not present

## 2015-02-18 DIAGNOSIS — C673 Malignant neoplasm of anterior wall of bladder: Secondary | ICD-10-CM | POA: Diagnosis not present

## 2015-02-18 DIAGNOSIS — T191XXA Foreign body in bladder, initial encounter: Secondary | ICD-10-CM | POA: Diagnosis not present

## 2015-02-21 ENCOUNTER — Other Ambulatory Visit: Payer: Self-pay

## 2015-02-21 DIAGNOSIS — E785 Hyperlipidemia, unspecified: Secondary | ICD-10-CM

## 2015-02-21 DIAGNOSIS — I10 Essential (primary) hypertension: Secondary | ICD-10-CM

## 2015-02-21 MED ORDER — ATORVASTATIN CALCIUM 10 MG PO TABS: 10.0000 mg | ORAL_TABLET | Freq: Every day | ORAL | Status: DC

## 2015-02-21 MED ORDER — LOSARTAN POTASSIUM-HCTZ 50-12.5 MG PO TABS: 1.0000 | ORAL_TABLET | Freq: Every day | ORAL | Status: DC

## 2015-03-05 DIAGNOSIS — C673 Malignant neoplasm of anterior wall of bladder: Secondary | ICD-10-CM | POA: Diagnosis not present

## 2015-03-05 DIAGNOSIS — N390 Urinary tract infection, site not specified: Secondary | ICD-10-CM | POA: Diagnosis not present

## 2015-03-05 DIAGNOSIS — R8281 Pyuria: Secondary | ICD-10-CM | POA: Insufficient documentation

## 2015-03-12 DIAGNOSIS — N3000 Acute cystitis without hematuria: Secondary | ICD-10-CM | POA: Diagnosis not present

## 2015-03-12 DIAGNOSIS — C673 Malignant neoplasm of anterior wall of bladder: Secondary | ICD-10-CM | POA: Diagnosis not present

## 2015-03-19 DIAGNOSIS — C673 Malignant neoplasm of anterior wall of bladder: Secondary | ICD-10-CM | POA: Diagnosis not present

## 2015-03-26 DIAGNOSIS — C673 Malignant neoplasm of anterior wall of bladder: Secondary | ICD-10-CM | POA: Diagnosis not present

## 2015-04-09 DIAGNOSIS — C673 Malignant neoplasm of anterior wall of bladder: Secondary | ICD-10-CM | POA: Diagnosis not present

## 2015-04-10 ENCOUNTER — Other Ambulatory Visit: Payer: Self-pay

## 2015-04-10 DIAGNOSIS — J441 Chronic obstructive pulmonary disease with (acute) exacerbation: Secondary | ICD-10-CM

## 2015-04-10 MED ORDER — UMECLIDINIUM-VILANTEROL 62.5-25 MCG/INH IN AEPB: 1.0000 | INHALATION_SPRAY | Freq: Every day | RESPIRATORY_TRACT | Status: DC

## 2015-04-15 DIAGNOSIS — I714 Abdominal aortic aneurysm, without rupture: Secondary | ICD-10-CM | POA: Diagnosis not present

## 2015-04-16 DIAGNOSIS — C673 Malignant neoplasm of anterior wall of bladder: Secondary | ICD-10-CM | POA: Diagnosis not present

## 2015-04-23 DIAGNOSIS — C673 Malignant neoplasm of anterior wall of bladder: Secondary | ICD-10-CM | POA: Diagnosis not present

## 2015-04-30 DIAGNOSIS — C673 Malignant neoplasm of anterior wall of bladder: Secondary | ICD-10-CM | POA: Diagnosis not present

## 2015-05-29 ENCOUNTER — Other Ambulatory Visit: Payer: Self-pay

## 2015-05-29 DIAGNOSIS — I10 Essential (primary) hypertension: Secondary | ICD-10-CM

## 2015-05-29 DIAGNOSIS — J441 Chronic obstructive pulmonary disease with (acute) exacerbation: Secondary | ICD-10-CM

## 2015-05-29 DIAGNOSIS — E785 Hyperlipidemia, unspecified: Secondary | ICD-10-CM

## 2015-05-29 MED ORDER — UMECLIDINIUM-VILANTEROL 62.5-25 MCG/INH IN AEPB: 1.0000 | INHALATION_SPRAY | Freq: Every day | RESPIRATORY_TRACT | Status: DC

## 2015-05-29 MED ORDER — ATORVASTATIN CALCIUM 10 MG PO TABS: 10.0000 mg | ORAL_TABLET | Freq: Every day | ORAL | Status: DC

## 2015-05-29 MED ORDER — LOSARTAN POTASSIUM-HCTZ 50-12.5 MG PO TABS: 1.0000 | ORAL_TABLET | Freq: Every day | ORAL | Status: DC

## 2015-06-06 ENCOUNTER — Ambulatory Visit: Payer: Self-pay | Admitting: Family Medicine

## 2015-06-11 ENCOUNTER — Ambulatory Visit (INDEPENDENT_AMBULATORY_CARE_PROVIDER_SITE_OTHER): Payer: Medicare Other | Admitting: Family Medicine

## 2015-06-11 ENCOUNTER — Encounter: Payer: Self-pay | Admitting: Family Medicine

## 2015-06-11 VITALS — BP 138/80 | HR 84 | Ht 62.0 in | Wt 198.0 lb

## 2015-06-11 DIAGNOSIS — J439 Emphysema, unspecified: Secondary | ICD-10-CM | POA: Diagnosis not present

## 2015-06-11 DIAGNOSIS — J441 Chronic obstructive pulmonary disease with (acute) exacerbation: Secondary | ICD-10-CM

## 2015-06-11 DIAGNOSIS — E785 Hyperlipidemia, unspecified: Secondary | ICD-10-CM | POA: Diagnosis not present

## 2015-06-11 DIAGNOSIS — I10 Essential (primary) hypertension: Secondary | ICD-10-CM | POA: Diagnosis not present

## 2015-06-11 DIAGNOSIS — J410 Simple chronic bronchitis: Secondary | ICD-10-CM | POA: Diagnosis not present

## 2015-06-11 MED ORDER — AZITHROMYCIN 250 MG PO TABS: ORAL_TABLET | ORAL | Status: DC

## 2015-06-11 MED ORDER — LOSARTAN POTASSIUM-HCTZ 50-12.5 MG PO TABS: 1.0000 | ORAL_TABLET | Freq: Every day | ORAL | Status: DC

## 2015-06-11 MED ORDER — ATORVASTATIN CALCIUM 10 MG PO TABS: 10.0000 mg | ORAL_TABLET | Freq: Every day | ORAL | Status: DC

## 2015-06-11 MED ORDER — UMECLIDINIUM-VILANTEROL 62.5-25 MCG/INH IN AEPB: 1.0000 | INHALATION_SPRAY | Freq: Every day | RESPIRATORY_TRACT | Status: DC

## 2015-06-11 NOTE — Progress Notes (Signed)
Name: Brittany Parks   MRN: [id number]    DOB: Dec 24, 1930   Date:06/11/2015       Progress Note  Subjective  Chief Complaint  Chief Complaint  Patient presents with  . Hypertension  . Hyperlipidemia  . COPD    Hypertension This is a chronic problem. The current episode started more than 1 year ago. The problem has been gradually improving since onset. The problem is controlled. Associated symptoms include chest pain and shortness of breath. Pertinent negatives include no anxiety, blurred vision, headaches, malaise/fatigue, neck pain, orthopnea, palpitations, peripheral edema, PND or sweats. There are no associated agents to hypertension. There are no known risk factors for coronary artery disease. Past treatments include angiotensin blockers and diuretics. The current treatment provides mild improvement. There are no compliance problems.  There is no history of angina, kidney disease, CAD/MI, CVA, heart failure, left ventricular hypertrophy, PVD, renovascular disease or retinopathy. There is no history of chronic renal disease or a hypertension causing med.  Hyperlipidemia This is a chronic problem. The current episode started more than 1 year ago. The problem is controlled. Exacerbating diseases include obesity. She has no history of chronic renal disease, diabetes, hypothyroidism, liver disease or nephrotic syndrome. Factors aggravating her hyperlipidemia include thiazides. Associated symptoms include chest pain, a focal sensory loss, a focal weakness, leg pain, myalgias and shortness of breath. Current antihyperlipidemic treatment includes statins. The current treatment provides mild improvement of lipids. There are no compliance problems.   Shortness of Breath This is a chronic problem. The current episode started more than 1 year ago. The problem occurs daily. Associated symptoms include chest pain and leg pain. Pertinent negatives include no abdominal pain, ear pain, fever, headaches, leg  swelling, neck pain, orthopnea, PND, rash, sore throat, sputum production or wheezing. Nothing aggravates the symptoms. She has tried leukotriene antagonists and beta agonist inhalers for the symptoms. The treatment provided mild relief. Her past medical history is significant for COPD. There is no history of allergies, DVT, a heart failure or PE.    No problem-specific assessment & plan notes found for this encounter.   Past Medical History  Diagnosis Date  . COPD (chronic obstructive pulmonary disease) ([place])   . Hyperlipidemia   . Hypertension     History reviewed. No pertinent past surgical history.  History reviewed. No pertinent family history.  Social History   Social History  . Marital Status: Widowed    Spouse Name: N/A  . Number of Children: N/A  . Years of Education: N/A   Occupational History  . Not on file.   Social History Main Topics  . Smoking status: Former Research scientist (life sciences)  . Smokeless tobacco: Not on file  . Alcohol Use: No  . Drug Use: No  . Sexual Activity: No   Other Topics Concern  . Not on file   Social History Narrative    Allergies  Allergen Reactions  . Other Other (See Comments)    Uncoded Allergy. Allergen: ivp dye Uncoded Allergy. Allergen: Shellfish  . Penicillins      Review of Systems  Constitutional: Negative for fever, chills, weight loss and malaise/fatigue.  HENT: Negative for ear discharge, ear pain and sore throat.   Eyes: Negative for blurred vision.  Respiratory: Positive for shortness of breath. Negative for cough, sputum production and wheezing.   Cardiovascular: Positive for chest pain. Negative for palpitations, orthopnea, leg swelling and PND.  Gastrointestinal: Negative for heartburn, nausea, abdominal pain, diarrhea, constipation, blood in stool and melena.  Genitourinary: Negative for dysuria, urgency, frequency and hematuria.  Musculoskeletal: Positive for myalgias. Negative for back pain, joint pain and neck pain.   Skin: Negative for rash.  Neurological: Positive for focal weakness. Negative for dizziness, tingling, sensory change and headaches.  Endo/Heme/Allergies: Negative for environmental allergies and polydipsia. Does not bruise/bleed easily.  Psychiatric/Behavioral: Negative for depression and suicidal ideas. The patient is not nervous/anxious and does not have insomnia.      Objective  Filed Vitals:   06/11/15 0851  BP: 138/80  Pulse: 84  Height: 5\' 2"  (1.575 m)  Weight: 198 lb (89.812 kg)    Physical Exam  Constitutional: She is well-developed, well-nourished, and in no distress. No distress.  HENT:  Head: Normocephalic and atraumatic.  Right Ear: External ear normal.  Left Ear: External ear normal.  Nose: Nose normal.  Mouth/Throat: Oropharynx is clear and moist.  Eyes: Conjunctivae and EOM are normal. Pupils are equal, round, and reactive to light. Right eye exhibits no discharge. Left eye exhibits no discharge.  Neck: Normal range of motion. Neck supple. No JVD present. No thyromegaly present.  Cardiovascular: Normal rate, regular rhythm, normal heart sounds and intact distal pulses.  Exam reveals no gallop and no friction rub.   No murmur heard. Pulmonary/Chest: Effort normal and breath sounds normal.  Abdominal: Soft. Bowel sounds are normal. She exhibits no mass. There is no tenderness. There is no guarding.  Musculoskeletal: Normal range of motion. She exhibits no edema.  Lymphadenopathy:    She has no cervical adenopathy.  Neurological: She is alert. She has normal reflexes.  Skin: Skin is warm and dry. She is not diaphoretic.  Psychiatric: Mood and affect normal.  Nursing note and vitals reviewed.     Assessment & Plan  Problem List Items Addressed This Visit      Cardiovascular and Mediastinum   Hypertension - Primary   Relevant Medications   aspirin 81 MG tablet   atorvastatin (LIPITOR) 10 MG tablet   losartan-hydrochlorothiazide (HYZAAR) 50-12.5 MG  tablet   Other Relevant Orders   Renal Function Panel     Respiratory   COPD with acute exacerbation (HCC)   Relevant Medications   Umeclidinium-Vilanterol (ANORO ELLIPTA) 62.5-25 MCG/INH AEPB   azithromycin (ZITHROMAX) 250 MG tablet     Other   Hyperlipidemia   Relevant Medications   aspirin 81 MG tablet   atorvastatin (LIPITOR) 10 MG tablet   losartan-hydrochlorothiazide (HYZAAR) 50-12.5 MG tablet   Other Relevant Orders   Lipid Profile    Other Visit Diagnoses    Simple chronic bronchitis (HCC)        Relevant Medications    azithromycin (ZITHROMAX) 250 MG tablet    Pulmonary emphysema, unspecified emphysema type (HCC)        Relevant Medications    Umeclidinium-Vilanterol (ANORO ELLIPTA) 62.5-25 MCG/INH AEPB    azithromycin (ZITHROMAX) 250 MG tablet         Dr. Deanna Jones [facility]  06/11/2015

## 2015-06-12 LAB — RENAL FUNCTION PANEL
Albumin: 4.3 g/dL (ref 3.5–4.7)
BUN/Creatinine Ratio: 16 (ref 11–26)
BUN: 13 mg/dL (ref 8–27)
CALCIUM: 9.6 mg/dL (ref 8.7–10.3)
CHLORIDE: 100 mmol/L (ref 96–106)
CO2: 28 mmol/L (ref 18–29)
Creatinine, Ser: 0.79 mg/dL (ref 0.57–1.00)
GFR calc Af Amer: 81 mL/min/{1.73_m2} (ref >59)
GFR calc non Af Amer: 70 mL/min/{1.73_m2} (ref >59)
GLUCOSE: 102 mg/dL — AB (ref 65–99)
PHOSPHORUS: 3.6 mg/dL (ref 2.5–4.5)
POTASSIUM: 4.5 mmol/L (ref 3.5–5.2)
SODIUM: 143 mmol/L (ref 134–144)

## 2015-06-12 LAB — LIPID PANEL
CHOL/HDL RATIO: 4 ratio (ref 0.0–4.4)
Cholesterol, Total: 174 mg/dL (ref 100–199)
HDL: 43 mg/dL (ref >39)
LDL Calculated: 96 mg/dL (ref 0–99)
TRIGLYCERIDES: 177 mg/dL — AB (ref 0–149)
VLDL Cholesterol Cal: 35 mg/dL (ref 5–40)

## 2015-06-23 ENCOUNTER — Other Ambulatory Visit: Payer: Self-pay

## 2015-11-24 ENCOUNTER — Other Ambulatory Visit: Payer: Self-pay

## 2015-11-24 DIAGNOSIS — E785 Hyperlipidemia, unspecified: Secondary | ICD-10-CM

## 2015-11-24 DIAGNOSIS — J441 Chronic obstructive pulmonary disease with (acute) exacerbation: Secondary | ICD-10-CM

## 2015-11-24 DIAGNOSIS — I10 Essential (primary) hypertension: Secondary | ICD-10-CM

## 2015-11-24 MED ORDER — LOSARTAN POTASSIUM-HCTZ 50-12.5 MG PO TABS: 1.0000 | ORAL_TABLET | Freq: Every day | ORAL | Status: DC

## 2015-11-24 MED ORDER — ATORVASTATIN CALCIUM 10 MG PO TABS
10.0000 mg | ORAL_TABLET | Freq: Every day | ORAL | Status: DC
Start: 2015-11-24 — End: 2015-12-04

## 2015-11-24 MED ORDER — UMECLIDINIUM-VILANTEROL 62.5-25 MCG/INH IN AEPB
1.0000 | INHALATION_SPRAY | Freq: Every day | RESPIRATORY_TRACT | Status: DC
Start: 2015-11-24 — End: 2015-12-04

## 2015-12-04 ENCOUNTER — Ambulatory Visit (INDEPENDENT_AMBULATORY_CARE_PROVIDER_SITE_OTHER): Payer: Medicare Other | Admitting: Family Medicine

## 2015-12-04 ENCOUNTER — Encounter: Payer: Self-pay | Admitting: Family Medicine

## 2015-12-04 VITALS — BP 130/70 | HR 60 | Ht 62.0 in | Wt 190.0 lb

## 2015-12-04 DIAGNOSIS — J441 Chronic obstructive pulmonary disease with (acute) exacerbation: Secondary | ICD-10-CM

## 2015-12-04 DIAGNOSIS — E785 Hyperlipidemia, unspecified: Secondary | ICD-10-CM

## 2015-12-04 DIAGNOSIS — I10 Essential (primary) hypertension: Secondary | ICD-10-CM | POA: Diagnosis not present

## 2015-12-04 MED ORDER — ATORVASTATIN CALCIUM 10 MG PO TABS: 10.0000 mg | ORAL_TABLET | Freq: Every day | ORAL | Status: DC

## 2015-12-04 MED ORDER — UMECLIDINIUM-VILANTEROL 62.5-25 MCG/INH IN AEPB: 1.0000 | INHALATION_SPRAY | Freq: Every day | RESPIRATORY_TRACT | Status: DC

## 2015-12-04 MED ORDER — LOSARTAN POTASSIUM-HCTZ 50-12.5 MG PO TABS: 1.0000 | ORAL_TABLET | Freq: Every day | ORAL | Status: DC

## 2015-12-04 NOTE — Progress Notes (Signed)
Name: Brittany Parks   MRN: [id number]    DOB: [birth year]   Date:12/04/2015       Progress Note  Subjective  Chief Complaint  Chief Complaint  Patient presents with  . Hypertension  . Hyperlipidemia  . COPD    Hypertension This is a chronic problem. The current episode started more than 1 year ago. The problem has been gradually improving since onset. The problem is controlled. Pertinent negatives include no anxiety, blurred vision, chest pain, headaches, malaise/fatigue, neck pain, orthopnea, palpitations, peripheral edema, PND, shortness of breath or sweats. There are no associated agents to hypertension. Risk factors for coronary artery disease include dyslipidemia, obesity and post-menopausal state. Past treatments include angiotensin blockers and diuretics. The current treatment provides mild improvement. There are no compliance problems.  There is no history of angina, kidney disease, CAD/MI, CVA, heart failure, left ventricular hypertrophy, PVD, renovascular disease or retinopathy. There is no history of chronic renal disease or a hypertension causing med.  Hyperlipidemia This is a chronic problem. The current episode started more than 1 year ago. The problem is controlled. Recent lipid tests were reviewed and are normal. She has no history of chronic renal disease. Factors aggravating her hyperlipidemia include thiazides. Pertinent negatives include no chest pain, focal sensory loss, focal weakness, leg pain, myalgias or shortness of breath. Current antihyperlipidemic treatment includes statins. The current treatment provides mild improvement of lipids. There are no compliance problems.  Risk factors for coronary artery disease include dyslipidemia.    No problem-specific assessment & plan notes found for this encounter.   Past Medical History  Diagnosis Date  . COPD (chronic obstructive pulmonary disease) ([place])   . Hyperlipidemia   . Hypertension     History reviewed. No  pertinent past surgical history.  History reviewed. No pertinent family history.  Social History   Social History  . Marital Status: Widowed    Spouse Name: N/A  . Number of Children: N/A  . Years of Education: N/A   Occupational History  . Not on file.   Social History Main Topics  . Smoking status: Former Research scientist (life sciences)  . Smokeless tobacco: Not on file  . Alcohol Use: No  . Drug Use: No  . Sexual Activity: No   Other Topics Concern  . Not on file   Social History Narrative    Allergies  Allergen Reactions  . Other Other (See Comments)    Uncoded Allergy. Allergen: ivp dye Uncoded Allergy. Allergen: Shellfish  . Penicillins      Review of Systems  Constitutional: Negative for fever, chills, weight loss and malaise/fatigue.  HENT: Negative for ear discharge, ear pain and sore throat.   Eyes: Negative for blurred vision.  Respiratory: Negative for cough, sputum production, shortness of breath and wheezing.   Cardiovascular: Negative for chest pain, palpitations, orthopnea, leg swelling and PND.  Gastrointestinal: Negative for heartburn, nausea, abdominal pain, diarrhea, constipation, blood in stool and melena.  Genitourinary: Negative for dysuria, urgency, frequency and hematuria.  Musculoskeletal: Negative for myalgias, back pain, joint pain and neck pain.  Skin: Negative for rash.  Neurological: Negative for dizziness, tingling, sensory change, focal weakness and headaches.  Endo/Heme/Allergies: Negative for environmental allergies and polydipsia. Does not bruise/bleed easily.  Psychiatric/Behavioral: Negative for depression and suicidal ideas. The patient is not nervous/anxious and does not have insomnia.      Objective  Filed Vitals:   12/04/15 0908  BP: 130/70  Pulse: 60  Height: 5\' 2"  (1.575 m)  Weight: 190  lb (86.183 kg)    Physical Exam  Constitutional: She is well-developed, well-nourished, and in no distress. No distress.  HENT:  Head: Normocephalic  and atraumatic.  Right Ear: External ear normal.  Left Ear: External ear normal.  Nose: Nose normal.  Mouth/Throat: Oropharynx is clear and moist.  Eyes: Conjunctivae and EOM are normal. Pupils are equal, round, and reactive to light. Right eye exhibits no discharge. Left eye exhibits no discharge.  Neck: Normal range of motion. Neck supple. No JVD present. No thyromegaly present.  Cardiovascular: Normal rate, regular rhythm, normal heart sounds and intact distal pulses.  Exam reveals no gallop and no friction rub.   No murmur heard. Pulmonary/Chest: Effort normal and breath sounds normal.  Abdominal: Soft. Bowel sounds are normal. She exhibits no mass. There is no tenderness. There is no guarding.  Musculoskeletal: Normal range of motion. She exhibits no edema.  Lymphadenopathy:    She has no cervical adenopathy.  Neurological: She is alert.  Skin: Skin is warm and dry. She is not diaphoretic.  Psychiatric: Mood and affect normal.  Nursing note and vitals reviewed.     Assessment & Plan  Problem List Items Addressed This Visit      Cardiovascular and Mediastinum   Hypertension - Primary   Relevant Medications   losartan-hydrochlorothiazide (HYZAAR) 50-12.5 MG tablet   atorvastatin (LIPITOR) 10 MG tablet   Other Relevant Orders   Basic Metabolic Panel (BMET)     Respiratory   COPD with acute exacerbation (HCC)   Relevant Medications   umeclidinium-vilanterol (ANORO ELLIPTA) 62.5-25 MCG/INH AEPB     Other   Hyperlipidemia   Relevant Medications   losartan-hydrochlorothiazide (HYZAAR) 50-12.5 MG tablet   atorvastatin (LIPITOR) 10 MG tablet   Other Relevant Orders   Lipid Profile        Dr. Otilio Miu [facility]  12/04/2015

## 2015-12-05 LAB — BASIC METABOLIC PANEL
BUN / CREAT RATIO: 14 (ref 12–28)
BUN: 12 mg/dL (ref 8–27)
CHLORIDE: 100 mmol/L (ref 96–106)
CO2: 17 mmol/L — AB (ref 18–29)
Calcium: 9.5 mg/dL (ref 8.7–10.3)
Creatinine, Ser: 0.83 mg/dL (ref 0.57–1.00)
GFR calc Af Amer: 75 mL/min/{1.73_m2} (ref >59)
GFR calc non Af Amer: 65 mL/min/{1.73_m2} (ref >59)
GLUCOSE: 111 mg/dL — AB (ref 65–99)
POTASSIUM: 4.1 mmol/L (ref 3.5–5.2)
SODIUM: 143 mmol/L (ref 134–144)

## 2015-12-05 LAB — LIPID PANEL
CHOLESTEROL TOTAL: 166 mg/dL (ref 100–199)
Chol/HDL Ratio: 4 ratio units (ref 0.0–4.4)
HDL: 41 mg/dL (ref >39)
LDL CALC: 85 mg/dL (ref 0–99)
TRIGLYCERIDES: 200 mg/dL — AB (ref 0–149)
VLDL Cholesterol Cal: 40 mg/dL (ref 5–40)

## 2016-06-01 ENCOUNTER — Other Ambulatory Visit: Payer: Self-pay

## 2016-06-01 DIAGNOSIS — I10 Essential (primary) hypertension: Secondary | ICD-10-CM

## 2016-06-01 DIAGNOSIS — E7800 Pure hypercholesterolemia, unspecified: Secondary | ICD-10-CM

## 2016-06-01 DIAGNOSIS — J441 Chronic obstructive pulmonary disease with (acute) exacerbation: Secondary | ICD-10-CM

## 2016-06-01 MED ORDER — UMECLIDINIUM-VILANTEROL 62.5-25 MCG/INH IN AEPB: 1.0000 | INHALATION_SPRAY | Freq: Every day | RESPIRATORY_TRACT | 0 refills | Status: DC

## 2016-06-01 MED ORDER — LOSARTAN POTASSIUM-HCTZ 50-12.5 MG PO TABS: 1.0000 | ORAL_TABLET | Freq: Every day | ORAL | 0 refills | Status: DC

## 2016-06-01 MED ORDER — ATORVASTATIN CALCIUM 10 MG PO TABS: 10.0000 mg | ORAL_TABLET | Freq: Every day | ORAL | 0 refills | Status: DC

## 2016-06-16 ENCOUNTER — Ambulatory Visit: Payer: Self-pay | Admitting: Family Medicine

## 2016-06-21 ENCOUNTER — Ambulatory Visit (INDEPENDENT_AMBULATORY_CARE_PROVIDER_SITE_OTHER): Payer: Medicare Other | Admitting: Family Medicine

## 2016-06-21 ENCOUNTER — Encounter: Payer: Self-pay | Admitting: Family Medicine

## 2016-06-21 VITALS — BP 138/80 | HR 64 | Ht 62.0 in | Wt 183.0 lb

## 2016-06-21 DIAGNOSIS — E7800 Pure hypercholesterolemia, unspecified: Secondary | ICD-10-CM | POA: Diagnosis not present

## 2016-06-21 DIAGNOSIS — J441 Chronic obstructive pulmonary disease with (acute) exacerbation: Secondary | ICD-10-CM | POA: Diagnosis not present

## 2016-06-21 DIAGNOSIS — I10 Essential (primary) hypertension: Secondary | ICD-10-CM | POA: Diagnosis not present

## 2016-06-21 DIAGNOSIS — J0100 Acute maxillary sinusitis, unspecified: Secondary | ICD-10-CM | POA: Diagnosis not present

## 2016-06-21 MED ORDER — LOSARTAN POTASSIUM-HCTZ 50-12.5 MG PO TABS: 1.0000 | ORAL_TABLET | Freq: Every day | ORAL | 2 refills | Status: DC

## 2016-06-21 MED ORDER — ATORVASTATIN CALCIUM 10 MG PO TABS: 10.0000 mg | ORAL_TABLET | Freq: Every day | ORAL | 2 refills | Status: DC

## 2016-06-21 MED ORDER — ASPIRIN 81 MG PO TABS: 81.0000 mg | ORAL_TABLET | Freq: Every day | ORAL | 11 refills | Status: DC

## 2016-06-21 MED ORDER — AZITHROMYCIN 250 MG PO TABS: ORAL_TABLET | ORAL | 0 refills | Status: DC

## 2016-06-21 MED ORDER — UMECLIDINIUM-VILANTEROL 62.5-25 MCG/INH IN AEPB: 1.0000 | INHALATION_SPRAY | Freq: Every day | RESPIRATORY_TRACT | 2 refills | Status: DC

## 2016-06-21 MED ORDER — ALBUTEROL SULFATE HFA 108 (90 BASE) MCG/ACT IN AERS: 2.0000 | INHALATION_SPRAY | Freq: Four times a day (QID) | RESPIRATORY_TRACT | 2 refills | Status: DC | PRN

## 2016-06-21 NOTE — Progress Notes (Signed)
Name: Brittany Parks   MRN: [id number]    DOB: [birth year]   Date:06/21/2016       Progress Note  Subjective  Chief Complaint  Chief Complaint  Patient presents with  . COPD  . Hypertension  . Hyperlipidemia  . Sinusitis    cough with yellow production    Hypertension  This is a chronic problem. The current episode started more than 1 year ago. The problem has been gradually improving since onset. The problem is controlled. Associated symptoms include shortness of breath. Pertinent negatives include no anxiety, blurred vision, chest pain, headaches, malaise/fatigue, neck pain, orthopnea, palpitations, peripheral edema, PND or sweats. There are no associated agents to hypertension. Risk factors for coronary artery disease include obesity and post-menopausal state. Past treatments include angiotensin blockers and diuretics. The current treatment provides no improvement. There are no compliance problems.  There is no history of angina, kidney disease, CAD/MI, CVA, heart failure, left ventricular hypertrophy, PVD, renovascular disease or retinopathy. There is no history of chronic renal disease or a hypertension causing med.  Hyperlipidemia  This is a chronic problem. The current episode started more than 1 year ago. The problem is controlled. Recent lipid tests were reviewed and are normal. She has no history of chronic renal disease, diabetes, hypothyroidism, liver disease, obesity or nephrotic syndrome. Factors aggravating her hyperlipidemia include thiazides. Associated symptoms include shortness of breath. Pertinent negatives include no chest pain, focal sensory loss, focal weakness, leg pain or myalgias. Current antihyperlipidemic treatment includes statins. The current treatment provides no improvement of lipids. There are no compliance problems.  Risk factors for coronary artery disease include dyslipidemia, hypertension and post-menopausal.  Sinusitis  This is a new problem. The current  episode started more than 1 year ago. The problem has been waxing and waning since onset. There has been no fever. The pain is mild. Associated symptoms include shortness of breath. Pertinent negatives include no chills, congestion, coughing, diaphoresis, ear pain, headaches, hoarse voice, neck pain, sinus pressure, sneezing, sore throat or swollen glands. Past treatments include nothing. The treatment provided no relief.  Shortness of Breath  This is a chronic (COPD) problem. The current episode started in the past 7 days. The problem occurs daily. The problem has been waxing and waning. Associated symptoms include sputum production and wheezing. Pertinent negatives include no abdominal pain, chest pain, ear pain, fever, headaches, leg pain, leg swelling, neck pain, orthopnea, PND, rash, sore throat, swollen glands or syncope. Nothing aggravates the symptoms. There is no history of a heart failure.    No problem-specific Assessment & Plan notes found for this encounter.   Past Medical History:  Diagnosis Date  . COPD (chronic obstructive pulmonary disease) ([place])   . Hyperlipidemia   . Hypertension     History reviewed. No pertinent surgical history.  History reviewed. No pertinent family history.  Social History   Social History  . Marital status: Widowed    Spouse name: N/A  . Number of children: N/A  . Years of education: N/A   Occupational History  . Not on file.   Social History Main Topics  . Smoking status: Former Research scientist (life sciences)  . Smokeless tobacco: Never Used  . Alcohol use No  . Drug use: No  . Sexual activity: No   Other Topics Concern  . Not on file   Social History Narrative  . No narrative on file    Allergies  Allergen Reactions  . Other Other (See Comments)    Uncoded Allergy.  Allergen: ivp dye Uncoded Allergy. Allergen: Shellfish  . Penicillins      Review of Systems  Constitutional: Negative for chills, diaphoresis, fever, malaise/fatigue and weight  loss.  HENT: Negative for congestion, ear discharge, ear pain, hoarse voice, sinus pressure, sneezing and sore throat.   Eyes: Negative for blurred vision.  Respiratory: Positive for sputum production, shortness of breath and wheezing. Negative for cough.   Cardiovascular: Negative for chest pain, palpitations, orthopnea, leg swelling, syncope and PND.  Gastrointestinal: Negative for abdominal pain, blood in stool, constipation, diarrhea, heartburn, melena and nausea.  Genitourinary: Negative for dysuria, frequency, hematuria and urgency.  Musculoskeletal: Negative for back pain, joint pain, myalgias and neck pain.  Skin: Negative for rash.  Neurological: Negative for dizziness, tingling, sensory change, focal weakness and headaches.  Endo/Heme/Allergies: Negative for environmental allergies and polydipsia. Does not bruise/bleed easily.  Psychiatric/Behavioral: Negative for depression and suicidal ideas. The patient is not nervous/anxious and does not have insomnia.      Objective  Vitals:   06/21/16 0903  BP: 138/80  Pulse: 64  Weight: 183 lb (83 kg)  Height: 5\' 2"  (1.575 m)    Physical Exam  Constitutional: She is well-developed, well-nourished, and in no distress. No distress.  HENT:  Head: Normocephalic and atraumatic.  Right Ear: External ear normal.  Left Ear: External ear normal.  Nose: Nose normal.  Mouth/Throat: Oropharynx is clear and moist.  Eyes: Conjunctivae and EOM are normal. Pupils are equal, round, and reactive to light. Right eye exhibits no discharge. Left eye exhibits no discharge.  Neck: Normal range of motion. Neck supple. No JVD present. No thyromegaly present.  Cardiovascular: Normal rate, regular rhythm, normal heart sounds and intact distal pulses.  Exam reveals no gallop and no friction rub.   No murmur heard. Pulmonary/Chest: Effort normal and breath sounds normal. She has no wheezes. She has no rales.  Abdominal: Soft. Bowel sounds are normal. She  exhibits no mass. There is no tenderness. There is no guarding.  Musculoskeletal: Normal range of motion. She exhibits no edema.  Lymphadenopathy:    She has no cervical adenopathy.  Neurological: She is alert. She has normal reflexes.  Skin: Skin is warm and dry. She is not diaphoretic.  Psychiatric: Mood and affect normal.  Nursing note and vitals reviewed.     Assessment & Plan  Problem List Items Addressed This Visit      Cardiovascular and Mediastinum   Hypertension   Relevant Medications   losartan-hydrochlorothiazide (HYZAAR) 50-12.5 MG tablet   atorvastatin (LIPITOR) 10 MG tablet   aspirin 81 MG tablet     Respiratory   COPD with acute exacerbation (HCC)   Relevant Medications   umeclidinium-vilanterol (ANORO ELLIPTA) 62.5-25 MCG/INH AEPB   albuterol (PROVENTIL HFA;VENTOLIN HFA) 108 (90 Base) MCG/ACT inhaler   azithromycin (ZITHROMAX) 250 MG tablet     Other   Hyperlipidemia   Relevant Medications   losartan-hydrochlorothiazide (HYZAAR) 50-12.5 MG tablet   atorvastatin (LIPITOR) 10 MG tablet   aspirin 81 MG tablet    Other Visit Diagnoses    Acute non-recurrent maxillary sinusitis    -  Primary   Relevant Medications   azithromycin (ZITHROMAX) 250 MG tablet        Dr. Deanna Jones [facility]  06/21/16

## 2016-08-04 DIAGNOSIS — R0902 Hypoxemia: Secondary | ICD-10-CM | POA: Insufficient documentation

## 2016-08-10 ENCOUNTER — Inpatient Hospital Stay: Payer: Medicare Other | Admitting: Family Medicine

## 2016-08-16 ENCOUNTER — Ambulatory Visit (INDEPENDENT_AMBULATORY_CARE_PROVIDER_SITE_OTHER): Payer: Medicare Other | Admitting: Family Medicine

## 2016-08-16 ENCOUNTER — Encounter: Payer: Self-pay | Admitting: Family Medicine

## 2016-08-16 VITALS — BP 118/60 | HR 80 | Ht 62.0 in | Wt 184.0 lb

## 2016-08-16 DIAGNOSIS — J449 Chronic obstructive pulmonary disease, unspecified: Secondary | ICD-10-CM

## 2016-08-16 DIAGNOSIS — Z09 Encounter for follow-up examination after completed treatment for conditions other than malignant neoplasm: Secondary | ICD-10-CM

## 2016-08-16 DIAGNOSIS — F17210 Nicotine dependence, cigarettes, uncomplicated: Secondary | ICD-10-CM | POA: Diagnosis not present

## 2016-08-16 MED ORDER — BUPROPION HCL ER (SMOKING DET) 150 MG PO TB12: 150.0000 mg | ORAL_TABLET | Freq: Two times a day (BID) | ORAL | 2 refills | Status: DC

## 2016-08-16 NOTE — Progress Notes (Signed)
Name: Brittany Parks   MRN: [id number]    DOB: [birth year]   Date:08/16/2016       Progress Note  Subjective  Chief Complaint  Chief Complaint  Patient presents with  . 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 Nicotine Dependence    allergic to patches- wants to try Welbutrin    Patient presents for discharge from [facility].   Nicotine Dependence  Presents for initial visit. Symptoms include cravings. Symptoms are negative for insomnia and sore throat. Preferred tobacco types include cigarettes. Preferred cigarette types include filtered. Her urge triggers include company of smokers. She smokes < 1/2 a pack of cigarettes per day. Past treatments include nicotine patch and buproprion. The treatment provided moderate relief. Compliance with prior treatments has been variable. Brittany Parks is ready to quit.    No problem-specific Assessment & Plan notes found for this encounter.   Past Medical History:  Diagnosis Date  . COPD (chronic obstructive pulmonary disease) ([place])   . Hyperlipidemia   . Hypertension     No past surgical history on file.  No family history on file.  Social History   Social History  . Marital status: Widowed    Spouse name: N/A  . Number of children: N/A  . Years of education: N/A   Occupational History  . Not on file.   Social History Main Topics  . Smoking status: Former Research scientist (life sciences)  . Smokeless tobacco: Never Used  . Alcohol use No  . Drug use: No  . Sexual activity: No   Other Topics Concern  . Not on file   Social History Narrative  . No narrative on file    Allergies  Allergen Reactions  . Other Other (See Comments)    Uncoded Allergy. Allergen: ivp dye Uncoded Allergy. Allergen: Shellfish  . Penicillins     Outpatient Medications Prior to Visit  Medication Sig Dispense Refill  . albuterol (PROVENTIL HFA;VENTOLIN HFA) 108 (90 Base) MCG/ACT inhaler  Inhale 2 puffs into the lungs every 6 (six) hours as needed for wheezing or shortness of breath. 2 Inhaler 2  . aspirin 81 MG tablet Take 1 tablet (81 mg total) by mouth daily. 30 tablet 11  . atorvastatin (LIPITOR) 10 MG tablet Take 1 tablet (10 mg total) by mouth daily. 90 tablet 2  . losartan-hydrochlorothiazide (HYZAAR) 50-12.5 MG tablet Take 1 tablet by mouth daily. 90 tablet 2  . umeclidinium-vilanterol (ANORO ELLIPTA) 62.5-25 MCG/INH AEPB Inhale 1 puff into the lungs daily. 3 each 2  . azithromycin (ZITHROMAX) 250 MG tablet 2 today then 1 a day for 4 days 6 tablet 0   No facility-administered medications prior to visit.     Review of Systems  Constitutional: Negative for chills, fever, malaise/fatigue and weight loss.  HENT: Negative for ear discharge, ear pain and sore throat.   Eyes: Negative for blurred vision.  Respiratory: Negative for cough, sputum production, shortness of breath and wheezing.   Cardiovascular: Negative for chest pain, palpitations and leg swelling.  Gastrointestinal: Negative for abdominal pain, blood in stool, constipation, diarrhea, heartburn, melena and nausea.  Genitourinary: Negative for dysuria, frequency, hematuria and urgency.  Musculoskeletal: Negative for back pain, joint pain, myalgias and neck pain.  Skin: Negative for rash.  Neurological: Negative for dizziness, tingling, sensory change, focal weakness and headaches.  Endo/Heme/Allergies: Negative for environmental allergies and polydipsia. Does not bruise/bleed easily.  Psychiatric/Behavioral: Negative for depression and suicidal ideas. The patient is not nervous/anxious and does not have insomnia.      Objective  Vitals:   08/16/16 1104  BP: 118/60  Pulse: 80  Weight: 184 lb (83.5 kg)  Height: 5\' 2"  (1.575 m)    Physical Exam  Constitutional: She is well-developed, well-nourished, and in no distress. No distress.  HENT:  Head: Normocephalic and atraumatic.  Right Ear: External ear  normal.  Left Ear: External ear normal.  Nose: Nose normal.  Mouth/Throat: Oropharynx is clear and moist.  Eyes: Conjunctivae and EOM are normal. Pupils are equal, round, and reactive to light. Right eye exhibits no discharge. Left eye exhibits no discharge.  Neck: Normal range of motion. Neck supple. No JVD present. No thyromegaly present.  Cardiovascular: Normal rate, regular rhythm, normal heart sounds and intact distal pulses.  Exam reveals no gallop and no friction rub.   No murmur heard. Pulmonary/Chest: Effort normal. She has wheezes.  Abdominal: Soft. Bowel sounds are normal. She exhibits no mass. There is no tenderness. There is no guarding.  Musculoskeletal: Normal range of motion. She exhibits no edema.  Lymphadenopathy:    She has no cervical adenopathy.  Neurological: She is alert.  Skin: Skin is warm and dry. She is not diaphoretic.  Psychiatric: Mood and affect normal.  Nursing note and vitals reviewed.     Assessment & Plan  Problem List Items Addressed This Visit    None    Visit Diagnoses    Hospital discharge follow-up    -  Primary   Cigarette nicotine dependence without complication       Relevant Medications   buPROPion (ZYBAN) 150 MG 12 hr tablet   Chronic obstructive pulmonary disease, unspecified COPD type ([place])       cont anoro/proventil   Relevant Medications   buPROPion (ZYBAN) 150 MG 12 hr tablet   Other Relevant Orders   Ambulatory referral to Pulmonology      Meds ordered this encounter  Medications  . buPROPion (ZYBAN) 150 MG 12 hr tablet    Sig: Take 1 tablet (150 mg total) by mouth 2 (two) times daily.    Dispense:  60 tablet    Refill:  2      Dr. Mac[facility]  08/16/16

## 2016-10-11 ENCOUNTER — Other Ambulatory Visit: Payer: Self-pay | Admitting: Family Medicine

## 2016-11-30 ENCOUNTER — Other Ambulatory Visit: Payer: Self-pay

## 2016-11-30 ENCOUNTER — Telehealth: Payer: Self-pay

## 2016-11-30 DIAGNOSIS — E7800 Pure hypercholesterolemia, unspecified: Secondary | ICD-10-CM

## 2016-11-30 DIAGNOSIS — I10 Essential (primary) hypertension: Secondary | ICD-10-CM

## 2016-11-30 MED ORDER — ATORVASTATIN CALCIUM 10 MG PO TABS: 10.0000 mg | ORAL_TABLET | Freq: Every day | ORAL | 0 refills | Status: DC

## 2016-11-30 MED ORDER — LOSARTAN POTASSIUM-HCTZ 50-12.5 MG PO TABS: 1.0000 | ORAL_TABLET | Freq: Every day | ORAL | 0 refills | Status: DC

## 2016-11-30 NOTE — Telephone Encounter (Signed)
Pt called in and wanted refills on B/P med and chol med- sent in to [facility]

## 2017-01-17 ENCOUNTER — Other Ambulatory Visit: Payer: Self-pay | Admitting: Family Medicine

## 2017-02-28 ENCOUNTER — Other Ambulatory Visit: Payer: Self-pay

## 2017-02-28 DIAGNOSIS — J441 Chronic obstructive pulmonary disease with (acute) exacerbation: Secondary | ICD-10-CM

## 2017-02-28 DIAGNOSIS — E7800 Pure hypercholesterolemia, unspecified: Secondary | ICD-10-CM

## 2017-02-28 DIAGNOSIS — I10 Essential (primary) hypertension: Secondary | ICD-10-CM

## 2017-02-28 MED ORDER — LOSARTAN POTASSIUM-HCTZ 50-12.5 MG PO TABS: 1.0000 | ORAL_TABLET | Freq: Every day | ORAL | 0 refills | Status: DC

## 2017-02-28 MED ORDER — UMECLIDINIUM-VILANTEROL 62.5-25 MCG/INH IN AEPB: 1.0000 | INHALATION_SPRAY | Freq: Every day | RESPIRATORY_TRACT | 0 refills | Status: DC

## 2017-02-28 MED ORDER — ATORVASTATIN CALCIUM 10 MG PO TABS: 10.0000 mg | ORAL_TABLET | Freq: Every day | ORAL | 0 refills | Status: DC

## 2017-04-25 ENCOUNTER — Telehealth: Payer: Self-pay | Admitting: Family Medicine

## 2017-04-25 NOTE — Telephone Encounter (Signed)
Called pt to sched for AWV with Nurse Health Advisor. c/b #  [phone] on Skype [email or web address] if you have questions ° °

## 2017-05-09 ENCOUNTER — Ambulatory Visit: Payer: Self-pay | Admitting: Family Medicine

## 2017-05-10 ENCOUNTER — Telehealth: Payer: Self-pay | Admitting: Family Medicine

## 2017-05-10 NOTE — Telephone Encounter (Signed)
Called pt to sched for AWV with Nurse Health Advisor.  C/b #  [phone] on Skype [email or web address] if you have questions ° °

## 2017-05-16 ENCOUNTER — Ambulatory Visit: Payer: Medicare Other | Admitting: Family Medicine

## 2017-05-16 ENCOUNTER — Encounter: Payer: Self-pay | Admitting: Family Medicine

## 2017-05-16 ENCOUNTER — Other Ambulatory Visit: Payer: Self-pay

## 2017-05-16 VITALS — BP 140/85 | HR 101 | Resp 16 | Ht 62.0 in | Wt 198.8 lb

## 2017-05-16 DIAGNOSIS — N3941 Urge incontinence: Secondary | ICD-10-CM

## 2017-05-16 DIAGNOSIS — J432 Centrilobular emphysema: Secondary | ICD-10-CM | POA: Diagnosis not present

## 2017-05-16 DIAGNOSIS — I10 Essential (primary) hypertension: Secondary | ICD-10-CM

## 2017-05-16 MED ORDER — LOSARTAN POTASSIUM-HCTZ 50-12.5 MG PO TABS: 1.0000 | ORAL_TABLET | Freq: Every day | ORAL | 6 refills | Status: DC

## 2017-05-16 MED ORDER — ALBUTEROL SULFATE HFA 108 (90 BASE) MCG/ACT IN AERS: 2.0000 | INHALATION_SPRAY | Freq: Four times a day (QID) | RESPIRATORY_TRACT | 6 refills | Status: DC | PRN

## 2017-05-16 MED ORDER — UMECLIDINIUM-VILANTEROL 62.5-25 MCG/INH IN AEPB: 1.0000 | INHALATION_SPRAY | Freq: Every day | RESPIRATORY_TRACT | 1 refills | Status: DC

## 2017-05-16 MED ORDER — UMECLIDINIUM-VILANTEROL 62.5-25 MCG/INH IN AEPB: 1.0000 | INHALATION_SPRAY | Freq: Every day | RESPIRATORY_TRACT | 6 refills | Status: DC

## 2017-05-16 MED ORDER — OXYBUTYNIN CHLORIDE 5 MG PO TABS: 5.0000 mg | ORAL_TABLET | Freq: Every morning | ORAL | 6 refills | Status: DC

## 2017-05-16 MED ORDER — OXYBUTYNIN CHLORIDE 5 MG PO TABS: 5.0000 mg | ORAL_TABLET | Freq: Every morning | ORAL | 1 refills | Status: DC

## 2017-05-16 MED ORDER — LOSARTAN POTASSIUM-HCTZ 50-12.5 MG PO TABS: 1.0000 | ORAL_TABLET | Freq: Every day | ORAL | 1 refills | Status: DC

## 2017-05-16 MED ORDER — ALBUTEROL SULFATE HFA 108 (90 BASE) MCG/ACT IN AERS: 2.0000 | INHALATION_SPRAY | Freq: Four times a day (QID) | RESPIRATORY_TRACT | 1 refills | Status: DC | PRN

## 2017-05-16 NOTE — Progress Notes (Signed)
Name: Brittany Parks   MRN: [id number]    DOB: 1931/02/11   Date:05/16/2017       Progress Note  Subjective  Chief Complaint  Chief Complaint  Patient presents with  . Hypertension    Hypertension  This is a chronic problem. The current episode started more than 1 year ago. The problem is unchanged. The problem is controlled. Pertinent negatives include no anxiety, blurred vision, chest pain, headaches, malaise/fatigue, neck pain, orthopnea, palpitations, peripheral edema, PND, shortness of breath or sweats. There are no associated agents to hypertension. There are no known risk factors for coronary artery disease. Past treatments include angiotensin blockers and diuretics. The current treatment provides no improvement. There are no compliance problems.  There is no history of angina, kidney disease, CAD/MI, CVA, heart failure, left ventricular hypertrophy, PVD or retinopathy. There is no history of chronic renal disease, a hypertension causing med or renovascular disease.  Urinary Frequency   This is a new problem. The current episode started more than 1 year ago. The problem has been waxing and waning. The pain is moderate. Pertinent negatives include no chills, discharge, flank pain, frequency, hematuria, hesitancy, nausea, sweats or urgency. She has tried nothing for the symptoms. The treatment provided mild relief.  Shortness of Breath  This is a chronic problem. The current episode started more than 1 year ago. The problem has been waxing and waning. Pertinent negatives include no abdominal pain, chest pain, ear pain, fever, headaches, leg swelling, neck pain, orthopnea, PND, rash, sore throat, sputum production or wheezing. Risk factors: quit smoking. The treatment provided moderate relief. There is no history of a heart failure.    No problem-specific Assessment & Plan notes found for this encounter.   Past Medical History:  Diagnosis Date  . COPD (chronic obstructive pulmonary  disease) ([place])   . Hyperlipidemia   . Hypertension     History reviewed. No pertinent surgical history.  History reviewed. No pertinent family history.  Social History   Socioeconomic History  . Marital status: Widowed    Spouse name: Not on file  . Number of children: Not on file  . Years of education: Not on file  . Highest education level: Not on file  Social Needs  . Financial resource strain: Not on file  . Food insecurity - worry: Not on file  . Food insecurity - inability: Not on file  . Transportation needs - medical: Not on file  . Transportation needs - non-medical: Not on file  Occupational History  . Not on file  Tobacco Use  . Smoking status: Former Research scientist (life sciences)  . Smokeless tobacco: Never Used  Substance and Sexual Activity  . Alcohol use: No    Alcohol/week: 0.0 oz  . Drug use: No  . Sexual activity: No  Other Topics Concern  . Not on file  Social History Narrative  . Not on file    Allergies  Allergen Reactions  . Other Other (See Comments), Itching and Swelling    Uncoded Allergy. Allergen: ivp dye Uncoded Allergy. Allergen: Shellfish Contraindicated due to shellfish allergy Uncoded Allergy. Allergen: Shellfish Uncoded Allergy. Allergen: ivp dye   . Penicillins Itching, Other (See Comments) and Swelling    Other Reaction: Other reaction   . Shellfish-Derived Products Itching and Swelling  . Azithromycin Rash    Outpatient Medications Prior to Visit  Medication Sig Dispense Refill  . aspirin (GOODSENSE ASPIRIN) 325 MG tablet Take 325 mg by mouth.    . dimenhyDRINATE (DRAMAMINE) 50  MG tablet Take 50 mg by mouth.    . diphenhydrAMINE (BENADRYL) 25 mg capsule Take 25 mg by mouth.    Marland Kitchen albuterol (PROVENTIL HFA;VENTOLIN HFA) 108 (90 Base) MCG/ACT inhaler Inhale into the lungs.    Marland Kitchen buPROPion (WELLBUTRIN) 100 MG tablet Take 100 mg by mouth.    . losartan-hydrochlorothiazide (HYZAAR) 50-12.5 MG tablet Take by mouth.    . oxybutynin (DITROPAN) 5 MG  tablet Take 5 mg by mouth.    . umeclidinium-vilanterol (ANORO ELLIPTA) 62.5-25 MCG/INH AEPB Inhale into the lungs.    Marland Kitchen albuterol (PROVENTIL HFA;VENTOLIN HFA) 108 (90 Base) MCG/ACT inhaler Inhale into the lungs.    Marland Kitchen aspirin 81 MG tablet Take 1 tablet (81 mg total) by mouth daily. 30 tablet 11  . guaiFENesin (MUCINEX) 600 MG 12 hr tablet Take 1,200 mg by mouth.    . promethazine (PHENERGAN) 25 MG tablet Take 25 mg by mouth.     No facility-administered medications prior to visit.     Review of Systems  Constitutional: Negative for chills, fever, malaise/fatigue and weight loss.  HENT: Negative for ear discharge, ear pain and sore throat.   Eyes: Negative for blurred vision.  Respiratory: Negative for cough, sputum production, shortness of breath and wheezing.   Cardiovascular: Negative for chest pain, palpitations, orthopnea, leg swelling and PND.  Gastrointestinal: Negative for abdominal pain, blood in stool, constipation, diarrhea, heartburn, melena and nausea.  Genitourinary: Negative for dysuria, flank pain, frequency, hematuria, hesitancy and urgency.  Musculoskeletal: Negative for back pain, joint pain, myalgias and neck pain.  Skin: Negative for rash.  Neurological: Negative for dizziness, tingling, sensory change, focal weakness and headaches.  Endo/Heme/Allergies: Negative for environmental allergies and polydipsia. Does not bruise/bleed easily.  Psychiatric/Behavioral: Negative for depression and suicidal ideas. The patient is not nervous/anxious and does not have insomnia.      Objective  Vitals:   05/16/17 0907  BP: 140/85  Pulse: (!) 101  Resp: 16  SpO2: 98%  Weight: 198 lb 12.8 oz (90.2 kg)  Height: 5\' 2"  (1.575 m)    Physical Exam  Constitutional: She is well-developed, well-nourished, and in no distress. No distress.  HENT:  Head: Normocephalic and atraumatic.  Right Ear: External ear normal.  Left Ear: External ear normal.  Nose: Nose normal.   Mouth/Throat: Oropharynx is clear and moist.  Eyes: Conjunctivae and EOM are normal. Pupils are equal, round, and reactive to light. Right eye exhibits no discharge. Left eye exhibits no discharge.  Neck: Normal range of motion. Neck supple. No JVD present. No thyromegaly present.  Cardiovascular: Normal rate, regular rhythm, normal heart sounds and intact distal pulses. Exam reveals no gallop and no friction rub.  No murmur heard. Pulmonary/Chest: Effort normal and breath sounds normal. She has no wheezes. She has no rales.  Abdominal: Soft. Bowel sounds are normal. She exhibits no mass. There is no tenderness. There is no guarding.  Musculoskeletal: Normal range of motion. She exhibits no edema.  Lymphadenopathy:    She has no cervical adenopathy.  Neurological: She is alert. She has normal reflexes.  Skin: Skin is warm and dry. She is not diaphoretic.  Psychiatric: Mood and affect normal.  Nursing note and vitals reviewed.     Assessment & Plan  Problem List Items Addressed This Visit      Cardiovascular and Mediastinum   Essential hypertension - Primary   Relevant Medications   losartan-hydrochlorothiazide (HYZAAR) 50-12.5 MG tablet    Other Visit Diagnoses    Urge  incontinence       Relevant Medications   oxybutynin (DITROPAN) 5 MG tablet   Centrilobular emphysema (HCC)       Relevant Medications   albuterol (PROVENTIL HFA;VENTOLIN HFA) 108 (90 Base) MCG/ACT inhaler   umeclidinium-vilanterol (ANORO ELLIPTA) 62.5-25 MCG/INH AEPB      Meds ordered this encounter  Medications  . DISCONTD: umeclidinium-vilanterol (ANORO ELLIPTA) 62.5-25 MCG/INH AEPB    Sig: Inhale 1 puff into the lungs daily.    Dispense:  1 each    Refill:  6  . DISCONTD: losartan-hydrochlorothiazide (HYZAAR) 50-12.5 MG tablet    Sig: Take 1 tablet by mouth daily.    Dispense:  30 tablet    Refill:  6  . DISCONTD: albuterol (PROVENTIL HFA;VENTOLIN HFA) 108 (90 Base) MCG/ACT inhaler    Sig: Inhale  2 puffs into the lungs every 6 (six) hours as needed for wheezing or shortness of breath.    Dispense:  1 Inhaler    Refill:  6  . DISCONTD: oxybutynin (DITROPAN) 5 MG tablet    Sig: Take 1 tablet (5 mg total) by mouth every morning.    Dispense:  30 tablet    Refill:  6  . albuterol (PROVENTIL HFA;VENTOLIN HFA) 108 (90 Base) MCG/ACT inhaler    Sig: Inhale 2 puffs into the lungs every 6 (six) hours as needed for wheezing or shortness of breath.    Dispense:  3 Inhaler    Refill:  1  . losartan-hydrochlorothiazide (HYZAAR) 50-12.5 MG tablet    Sig: Take 1 tablet by mouth daily.    Dispense:  90 tablet    Refill:  1  . oxybutynin (DITROPAN) 5 MG tablet    Sig: Take 1 tablet (5 mg total) by mouth every morning.    Dispense:  90 tablet    Refill:  1  . umeclidinium-vilanterol (ANORO ELLIPTA) 62.5-25 MCG/INH AEPB    Sig: Inhale 1 puff into the lungs daily.    Dispense:  3 each    Refill:  1      Dr. Macon [facility]  05/16/17

## 2017-08-03 ENCOUNTER — Telehealth: Payer: Self-pay | Admitting: Family Medicine

## 2017-08-03 NOTE — Telephone Encounter (Signed)
Called to schedule AWV with Nurse Health Advisor. °Brittany Parks °[phone]  °Skype [email or web address]  ° °

## 2017-08-04 ENCOUNTER — Other Ambulatory Visit: Payer: Self-pay

## 2017-08-17 ENCOUNTER — Other Ambulatory Visit: Payer: Self-pay

## 2017-08-17 DIAGNOSIS — N3941 Urge incontinence: Secondary | ICD-10-CM

## 2017-08-17 DIAGNOSIS — I10 Essential (primary) hypertension: Secondary | ICD-10-CM

## 2017-08-17 MED ORDER — OXYBUTYNIN CHLORIDE 5 MG PO TABS: 5.0000 mg | ORAL_TABLET | Freq: Every morning | ORAL | 0 refills | Status: DC

## 2017-08-17 MED ORDER — LOSARTAN POTASSIUM-HCTZ 50-12.5 MG PO TABS: 1.0000 | ORAL_TABLET | Freq: Every day | ORAL | 0 refills | Status: DC

## 2017-08-31 ENCOUNTER — Other Ambulatory Visit: Payer: Self-pay

## 2017-10-27 ENCOUNTER — Other Ambulatory Visit: Payer: Self-pay

## 2017-10-27 DIAGNOSIS — N3941 Urge incontinence: Secondary | ICD-10-CM

## 2017-10-27 DIAGNOSIS — I10 Essential (primary) hypertension: Secondary | ICD-10-CM

## 2017-10-27 MED ORDER — OXYBUTYNIN CHLORIDE 5 MG PO TABS: 5.0000 mg | ORAL_TABLET | Freq: Every morning | ORAL | 0 refills | Status: DC

## 2017-10-27 MED ORDER — LOSARTAN POTASSIUM-HCTZ 50-12.5 MG PO TABS: 1.0000 | ORAL_TABLET | Freq: Every day | ORAL | 0 refills | Status: DC

## 2017-11-18 ENCOUNTER — Encounter: Payer: Self-pay | Admitting: Family Medicine

## 2017-11-18 ENCOUNTER — Ambulatory Visit: Payer: Medicare Other | Admitting: Family Medicine

## 2017-11-18 VITALS — BP 130/70 | HR 95 | Ht 62.0 in | Wt 191.0 lb

## 2017-11-18 DIAGNOSIS — J441 Chronic obstructive pulmonary disease with (acute) exacerbation: Secondary | ICD-10-CM

## 2017-11-18 DIAGNOSIS — I10 Essential (primary) hypertension: Secondary | ICD-10-CM | POA: Diagnosis not present

## 2017-11-18 DIAGNOSIS — J432 Centrilobular emphysema: Secondary | ICD-10-CM | POA: Insufficient documentation

## 2017-11-18 MED ORDER — PREDNISONE 10 MG PO TABS: 10.0000 mg | ORAL_TABLET | Freq: Every day | ORAL | 0 refills | Status: DC

## 2017-11-18 MED ORDER — LOSARTAN POTASSIUM-HCTZ 50-12.5 MG PO TABS: 1.0000 | ORAL_TABLET | Freq: Every day | ORAL | 1 refills | Status: DC

## 2017-11-18 MED ORDER — ALBUTEROL SULFATE HFA 108 (90 BASE) MCG/ACT IN AERS: 2.0000 | INHALATION_SPRAY | Freq: Four times a day (QID) | RESPIRATORY_TRACT | 3 refills | Status: DC | PRN

## 2017-11-18 MED ORDER — UMECLIDINIUM-VILANTEROL 62.5-25 MCG/INH IN AEPB: 1.0000 | INHALATION_SPRAY | Freq: Every day | RESPIRATORY_TRACT | 3 refills | Status: DC

## 2017-11-18 NOTE — Progress Notes (Signed)
Name: Brittany Parks   MRN: [id number]    DOB: 1930/06/18   Date:11/18/2017       Progress Note  Subjective  Chief Complaint  Chief Complaint  Patient presents with  . COPD  . Hypertension  . overactive bladder    oxybutinin    COPD  She complains of difficulty breathing, shortness of breath and wheezing. There is no chest tightness, cough, frequent throat clearing, hemoptysis, hoarse voice or sputum production. This is a chronic problem. The current episode started more than 1 year ago. The problem occurs intermittently. The problem has been waxing and waning. Associated symptoms include dyspnea on exertion. Pertinent negatives include no appetite change, chest pain, ear congestion, ear pain, fever, headaches, heartburn, malaise/fatigue, myalgias, nasal congestion, orthopnea, PND, postnasal drip, rhinorrhea, sneezing, sore throat, sweats, trouble swallowing or weight loss. Her symptoms are aggravated by change in weather. Her symptoms are alleviated by beta-agonist and ipratropium (Patient NOT taking provental). She reports significant improvement on treatment. Risk factors for lung disease include smoking/tobacco exposure. Her past medical history is significant for COPD. There is no history of bronchitis.  Hypertension  This is a chronic problem. The current episode started more than 1 year ago. The problem has been waxing and waning since onset. The problem is uncontrolled. Associated symptoms include shortness of breath. Pertinent negatives include no blurred vision, chest pain, headaches, malaise/fatigue, neck pain, palpitations, PND or sweats. There are no associated agents to hypertension. Risk factors for coronary artery disease include smoking/tobacco exposure and post-menopausal state. Past treatments include angiotensin blockers and diuretics. The current treatment provides moderate improvement. There are no compliance problems.  There is no history of angina, kidney disease, CAD/MI,  CVA, heart failure, left ventricular hypertrophy, PVD or retinopathy. There is no history of chronic renal disease, a hypertension causing med or renovascular disease.    Essential hypertension Controlled. Will refill losartin-HCTZ 50/12.5 and check renal panel.  Centrilobular emphysema ([place]) Patient not taking proventil on regular basis. Will refill Anoro and proventil for daily use.    Past Medical History:  Diagnosis Date  . COPD (chronic obstructive pulmonary disease) ([place])   . Hyperlipidemia   . Hypertension     History reviewed. No pertinent surgical history.  History reviewed. No pertinent family history.  Social History   Socioeconomic History  . Marital status: Widowed    Spouse name: Not on file  . Number of children: Not on file  . Years of education: Not on file  . Highest education level: Not on file  Occupational History  . Not on file  Social Needs  . Financial resource strain: Not on file  . Food insecurity:    Worry: Not on file    Inability: Not on file  . Transportation needs:    Medical: Not on file    Non-medical: Not on file  Tobacco Use  . Smoking status: Former Research scientist (life sciences)  . Smokeless tobacco: Never Used  Substance and Sexual Activity  . Alcohol use: No    Alcohol/week: 0.0 oz  . Drug use: No  . Sexual activity: Never  Lifestyle  . Physical activity:    Days per week: Not on file    Minutes per session: Not on file  . Stress: Not on file  Relationships  . Social connections:    Talks on phone: Not on file    Gets together: Not on file    Attends religious service: Not on file    Active member of club  or organization: Not on file    Attends meetings of clubs or organizations: Not on file    Relationship status: Not on file  . Intimate partner violence:    Fear of current or ex partner: Not on file    Emotionally abused: Not on file    Physically abused: Not on file    Forced sexual activity: Not on file  Other Topics Concern  . Not  on file  Social History Narrative  . Not on file    Allergies  Allergen Reactions  . Other Other (See Comments), Itching and Swelling    Uncoded Allergy. Allergen: ivp dye Uncoded Allergy. Allergen: Shellfish Contraindicated due to shellfish allergy Uncoded Allergy. Allergen: Shellfish Uncoded Allergy. Allergen: ivp dye   . Penicillins Itching, Other (See Comments) and Swelling    Other Reaction: Other reaction   . Shellfish-Derived Products Itching and Swelling  . Azithromycin Rash    Outpatient Medications Prior to Visit  Medication Sig Dispense Refill  . aspirin (GOODSENSE ASPIRIN) 325 MG tablet Take 325 mg by mouth.    . dimenhyDRINATE (DRAMAMINE) 50 MG tablet Take 50 mg by mouth.    . diphenhydrAMINE (BENADRYL) 25 mg capsule Take 25 mg by mouth.    . oxybutynin (DITROPAN) 5 MG tablet Take 1 tablet (5 mg total) by mouth every morning. 90 tablet 0  . albuterol (PROVENTIL HFA;VENTOLIN HFA) 108 (90 Base) MCG/ACT inhaler Inhale 2 puffs into the lungs every 6 (six) hours as needed for wheezing or shortness of breath. 3 Inhaler 1  . losartan-hydrochlorothiazide (HYZAAR) 50-12.5 MG tablet Take 1 tablet by mouth daily. 90 tablet 0  . umeclidinium-vilanterol (ANORO ELLIPTA) 62.5-25 MCG/INH AEPB Inhale 1 puff into the lungs daily. 3 each 1   No facility-administered medications prior to visit.     Review of Systems  Constitutional: Negative for appetite change, chills, fever, malaise/fatigue and weight loss.  HENT: Negative for ear discharge, ear pain, hoarse voice, postnasal drip, rhinorrhea, sneezing, sore throat and trouble swallowing.   Eyes: Negative for blurred vision.  Respiratory: Positive for shortness of breath and wheezing. Negative for cough, hemoptysis and sputum production.   Cardiovascular: Positive for dyspnea on exertion. Negative for chest pain, palpitations, leg swelling and PND.  Gastrointestinal: Negative for abdominal pain, blood in stool, constipation,  diarrhea, heartburn, melena and nausea.  Genitourinary: Negative for dysuria, frequency, hematuria and urgency.  Musculoskeletal: Negative for back pain, joint pain, myalgias and neck pain.  Skin: Negative for rash.  Neurological: Negative for dizziness, tingling, sensory change, focal weakness and headaches.  Endo/Heme/Allergies: Negative for environmental allergies and polydipsia. Does not bruise/bleed easily.  Psychiatric/Behavioral: Negative for depression and suicidal ideas. The patient is not nervous/anxious and does not have insomnia.      Objective  Vitals:   11/18/17 0814  BP: 130/70  Pulse: 95  SpO2: 95%  Weight: 191 lb (86.6 kg)  Height: 5\' 2"  (1.575 m)    Physical Exam  Constitutional: She is oriented to person, place, and time. She appears well-developed and well-nourished.  HENT:  Head: Normocephalic.  Right Ear: External ear normal.  Left Ear: External ear normal.  Mouth/Throat: Oropharynx is clear and moist.  Eyes: Pupils are equal, round, and reactive to light. Conjunctivae and EOM are normal. Lids are everted and swept, no foreign bodies found. Left eye exhibits no hordeolum. No foreign body present in the left eye. Right conjunctiva is not injected. Left conjunctiva is not injected. No scleral icterus.  Neck: Normal range  of motion. Neck supple. No JVD present. No tracheal deviation present. No thyromegaly present.  Cardiovascular: Normal rate, regular rhythm, normal heart sounds and intact distal pulses. Exam reveals no gallop and no friction rub.  No murmur heard. Pulmonary/Chest: Effort normal. No stridor. Tachypnea noted. No respiratory distress. She has no decreased breath sounds. She has wheezes. She has no rales.  Abdominal: Soft. Bowel sounds are normal. She exhibits no mass. There is no hepatosplenomegaly. There is no tenderness. There is no rebound and no guarding.  Musculoskeletal: Normal range of motion. She exhibits no edema or tenderness.   Lymphadenopathy:    She has no cervical adenopathy.  Neurological: She is alert and oriented to person, place, and time. She has normal strength. She displays normal reflexes. No cranial nerve deficit.  Skin: Skin is warm. No rash noted.  Psychiatric: She has a normal mood and affect. Her mood appears not anxious. She does not exhibit a depressed mood.  Nursing note and vitals reviewed.     Assessment & Plan  Problem List Items Addressed This Visit      Cardiovascular and Mediastinum   Essential hypertension - Primary    Controlled. Will refill losartin-HCTZ 50/12.5 and check renal panel.      Relevant Medications   losartan-hydrochlorothiazide (HYZAAR) 50-12.5 MG tablet   Other Relevant Orders   Renal Function Panel     Respiratory   COPD exacerbation (HCC)   Relevant Medications   umeclidinium-vilanterol (ANORO ELLIPTA) 62.5-25 MCG/INH AEPB   albuterol (PROVENTIL HFA;VENTOLIN HFA) 108 (90 Base) MCG/ACT inhaler   predniSONE (DELTASONE) 10 MG tablet   Centrilobular emphysema (HCC)    Patient not taking proventil on regular basis. Will refill Anoro and proventil for daily use.       Relevant Medications   umeclidinium-vilanterol (ANORO ELLIPTA) 62.5-25 MCG/INH AEPB   albuterol (PROVENTIL HFA;VENTOLIN HFA) 108 (90 Base) MCG/ACT inhaler   predniSONE (DELTASONE) 10 MG tablet      Meds ordered this encounter  Medications  . umeclidinium-vilanterol (ANORO ELLIPTA) 62.5-25 MCG/INH AEPB    Sig: Inhale 1 puff into the lungs daily.    Dispense:  3 each    Refill:  3  . albuterol (PROVENTIL HFA;VENTOLIN HFA) 108 (90 Base) MCG/ACT inhaler    Sig: Inhale 2 puffs into the lungs every 6 (six) hours as needed for wheezing or shortness of breath.    Dispense:  3 Inhaler    Refill:  3  . losartan-hydrochlorothiazide (HYZAAR) 50-12.5 MG tablet    Sig: Take 1 tablet by mouth daily.    Dispense:  90 tablet    Refill:  1  . predniSONE (DELTASONE) 10 MG tablet    Sig: Take 1 tablet  (10 mg total) by mouth daily with breakfast.    Dispense:  10 tablet    Refill:  0      Dr. Mac[facility]  11/18/17

## 2017-11-18 NOTE — Assessment & Plan Note (Signed)
Patient not taking proventil on regular basis. Will refill Anoro and proventil for daily use.

## 2017-11-18 NOTE — Assessment & Plan Note (Signed)
Controlled. Will refill losartin-HCTZ 50/12.5 and check renal panel.

## 2017-11-19 LAB — RENAL FUNCTION PANEL
Albumin: 4.4 g/dL (ref 3.5–4.7)
BUN / CREAT RATIO: 19 (ref 12–28)
BUN: 21 mg/dL (ref 8–27)
CALCIUM: 9.4 mg/dL (ref 8.7–10.3)
CHLORIDE: 100 mmol/L (ref 96–106)
CO2: 24 mmol/L (ref 20–29)
Creatinine, Ser: 1.13 mg/dL — ABNORMAL HIGH (ref 0.57–1.00)
GFR calc Af Amer: 51 mL/min/{1.73_m2} — ABNORMAL LOW (ref >59)
GFR calc non Af Amer: 44 mL/min/{1.73_m2} — ABNORMAL LOW (ref >59)
Glucose: 104 mg/dL — ABNORMAL HIGH (ref 65–99)
Phosphorus: 3.6 mg/dL (ref 2.5–4.5)
Potassium: 4.3 mmol/L (ref 3.5–5.2)
SODIUM: 143 mmol/L (ref 134–144)

## 2017-11-21 ENCOUNTER — Ambulatory Visit: Payer: Self-pay

## 2017-12-21 ENCOUNTER — Other Ambulatory Visit: Payer: Self-pay | Admitting: Family Medicine

## 2017-12-21 ENCOUNTER — Other Ambulatory Visit: Payer: Self-pay

## 2017-12-21 ENCOUNTER — Ambulatory Visit (INDEPENDENT_AMBULATORY_CARE_PROVIDER_SITE_OTHER): Payer: Medicare Other

## 2017-12-21 VITALS — BP 132/64 | HR 78 | Temp 97.9°F | Resp 12 | Ht 62.0 in | Wt 193.8 lb

## 2017-12-21 DIAGNOSIS — N183 Chronic kidney disease, stage 3 unspecified: Secondary | ICD-10-CM

## 2017-12-21 DIAGNOSIS — Z0000 Encounter for general adult medical examination without abnormal findings: Secondary | ICD-10-CM

## 2017-12-21 DIAGNOSIS — Z9181 History of falling: Secondary | ICD-10-CM | POA: Diagnosis not present

## 2017-12-21 NOTE — Progress Notes (Signed)
Subjective:   Brittany Parks is a 84 y.o. female who presents for an Initial Medicare Annual Wellness Visit.  Review of Systems    N/A  Cardiac Risk Factors include: advanced age (>22men, >47 women);dyslipidemia;hypertension;obesity (BMI >30kg/m2);sedentary lifestyle     Objective:    Today's Vitals   12/21/17 0855  BP: 132/64  Pulse: 78  Resp: 12  Temp: 97.9 F (36.6 C)  TempSrc: Oral  SpO2: 92%  Weight: 193 lb 12.8 oz (87.9 kg)  Height: 5\' 2"  (1.575 m)   Body mass index is 35.45 kg/m.  Advanced Directives 12/21/2017  Does Patient Have a Medical Advance Directive? Yes  Type of Paramedic of [place];Living will  Copy of [place] in Chart? No - copy requested    Current Medications (verified) Outpatient Encounter Medications as of 12/21/2017  Medication Sig  . albuterol (PROVENTIL HFA;VENTOLIN HFA) 108 (90 Base) MCG/ACT inhaler Inhale 2 puffs into the lungs every 6 (six) hours as needed for wheezing or shortness of breath.  Marland Kitchen aspirin (GOODSENSE ASPIRIN) 325 MG tablet Take 325 mg by mouth.  . dimenhyDRINATE (DRAMAMINE) 50 MG tablet Take 50 mg by mouth.  . diphenhydrAMINE (BENADRYL) 25 mg capsule Take 25 mg by mouth.  . losartan-hydrochlorothiazide (HYZAAR) 50-12.5 MG tablet Take 1 tablet by mouth daily.  Marland Kitchen oxybutynin (DITROPAN) 5 MG tablet Take 1 tablet (5 mg total) by mouth every morning.  . umeclidinium-vilanterol (ANORO ELLIPTA) 62.5-25 MCG/INH AEPB Inhale 1 puff into the lungs daily.  . predniSONE (DELTASONE) 10 MG tablet Take 1 tablet (10 mg total) by mouth daily with breakfast.   No facility-administered encounter medications on file as of 12/21/2017.     Allergies (verified) Other; Penicillins; Shellfish-derived products; and Azithromycin   History: Past Medical History:  Diagnosis Date  . Abdominal aortic aneurysm (AAA) ([place])   . Adrenal mass, left ([place])   . COPD (chronic obstructive pulmonary disease)  ([place])   . COPD (chronic obstructive pulmonary disease) ([place])   . Emphysema lung ([place])   . Hyperlipidemia   . Hypertension   . Hypoxia    Past Surgical History:  Procedure Laterality Date  . BLADDER SURGERY    . BLADDER TUMOR EXCISION     Family History  Problem Relation Age of Onset  . Alzheimer's disease Mother   . Heart attack Father   . Aortic aneurysm Sister   . Cancer Brother   . Hepatomegaly Brother   . Alcohol abuse Brother    Social History   Socioeconomic History  . Marital status: Widowed    Spouse name: Not on file  . Number of children: 5  . Years of education: some college  . Highest education level: 12th grade  Occupational History    Employer: RETIRED  Social Needs  . Financial resource strain: Not hard at all  . Food insecurity:    Worry: Never true    Inability: Never true  . Transportation needs:    Medical: No    Non-medical: No  Tobacco Use  . Smoking status: Former Smoker    Packs/day: 1.00    Years: 70.00    Pack years: 70.00    Types: Cigarettes    Last attempt to quit: 07/2017    Years since quitting: 0.3  . Smokeless tobacco: Never Used  . Tobacco comment: smoking cessation materials not required  Substance and Sexual Activity  . Alcohol use: No    Alcohol/week: 0.0 oz  . Drug use:  No  . Sexual activity: Never  Lifestyle  . Physical activity:    Days per week: 0 days    Minutes per session: 0 min  . Stress: Not at all  Relationships  . Social connections:    Talks on phone: Patient refused    Gets together: Patient refused    Attends religious service: Patient refused    Active member of club or organization: Patient refused    Attends meetings of clubs or organizations: Patient refused    Relationship status: Widowed  Other Topics Concern  . Not on file  Social History Narrative  . Not on file    Tobacco Counseling Counseling given: No Comment: smoking cessation materials not required  Clinical Intake:  Pre-visit  preparation completed: Yes  Pain : No/denies pain   BMI - recorded: 35.45 Nutritional Status: BMI > 30  Obese Nutritional Risks: None Diabetes: No  How often do you need to have someone help you when you read instructions, pamphlets, or other written materials from your doctor or pharmacy?: 1 - Never  Interpreter Needed?: No  Information entered by :: AEversole, LPN   Activities of Daily Living In your present state of health, do you have any difficulty performing the following activities: 12/21/2017 05/16/2017  Hearing? N N  Comment B hearing aids; vertigo -  Vision? N N  Comment wears eyeglasses -  Difficulty concentrating or making decisions? N N  Walking or climbing stairs? Y N  Comment dyspnea -  Dressing or bathing? N N  Doing errands, shopping? Y N  Comment family transports -  Conservation officer, nature and eating ? N -  Comment partial upper dentures -  Using the Toilet? N -  In the past six months, have you accidently leaked urine? Y -  Comment incontinent -  Do you have problems with loss of bowel control? N -  Managing your Medications? N -  Managing your Finances? N -  Housekeeping or managing your Housekeeping? N -  Some recent data might be hidden   Immunizations and Health Maintenance  There is no immunization history on file for this patient. There are no preventive care reminders to display for this patient.  Patient Care Team: Juline Patch, MD as PCP - General (Family Medicine) Risa Grill, MD as Consulting Physician (Urology)  Indicate any recent Medical Services you may have received from other than [facility] providers in the past year (date may be approximate).     Assessment:   This is a routine wellness examination for [facility].  Hearing/Vision screen Vision Screening Comments: Sees Dr. Mallie Mussel for annual eye exams  Dietary issues and exercise activities discussed: Current Exercise Habits: The patient does not participate in regular exercise at  present, Exercise limited by: respiratory conditions(s);Other - see comments(vertigo)  Goals    . DIET - INCREASE WATER INTAKE     Recommend to drink at least 6-8 8oz glasses of water per day.      Depression Screen PHQ 2/9 Scores 12/21/2017 05/16/2017 12/04/2015 06/11/2015 01/13/2015  PHQ - 2 Score 0 0 0 0 0  PHQ- 9 Score 0 - - - -    Fall Risk Fall Risk  12/21/2017 05/16/2017 12/04/2015 06/11/2015 01/13/2015  Falls in the past year? No No No No No  Risk for fall due to : Impaired vision;History of fall(s);Impaired balance/gait - - - -  Risk for fall due to: Comment wears eyeglasses; vertigo; ambulates with cane; dizziness - - - -  FALL RISK PREVENTION PERTAINING TO HOME: Is your home free of loose throw rugs in walkways, pet beds, electrical cords, etc? Yes Is there adequate lighting in your home to reduce risk of falls?  Yes Are there stairs in or around your home WITH handrails? No stairs  ASSISTIVE DEVICES UTILIZED TO PREVENT FALLS: Use of a cane, walker or w/c? Yes, cane Grab bars in the bathroom? Yes  Shower chair or a place to sit while bathing? Yes An elevated toilet seat or a handicapped toilet? No  Timed Get Up and Go Performed: Yes. Pt ambulated 10 feet within 28 sec. Gait slow, steady and with the use of an assistive device. No intervention required at this time. Fall risk prevention has been discussed.  Community Resource Referral:  Pt declined my offer to send [employer] Referral to Care Guide for an elevated toilet seat. However, [employer] Referral placed to [employer] for [employer]. Pt states she has had a history of falls by picking up items from the floor. May benefit from a reacher to reduce future risk for falls.  Cognitive Function:     6CIT Screen 12/21/2017  What Year? 0 points  What month? 0 points  What time? 0 points  Count back from 20 0 points  Months in reverse 0 points  Repeat phrase 0 points  Total Score 0    Screening  Tests Health Maintenance  Topic Date Due  . INFLUENZA VACCINE  02/28/2018 (Originally 12/29/2017)  . DEXA SCAN  11/19/2018 (Originally 07/31/1997)  . TETANUS/TDAP  11/19/2018 (Originally 08/01/1949)  . PNA vac Low Risk Adult (1 of 2 - PCV13) 11/19/2018 (Originally 07/31/1997)    Qualifies for Shingles Vaccine? Yes. Due for Shingrix. Education has been provided regarding the importance of this vaccine. Pt has been advised to call insurance company to determine out of pocket expense. Advised may also receive vaccine at local pharmacy or Health Dept. Verbalized acceptance and understanding.  Overdue for Flu vaccine. Education has been provided regarding the importance of this vaccine and advised to receive when available. Verbalized acceptance and understanding.  Due for Pneumoccocal vaccine. Declined my offer to administer today. Education has been provided regarding the importance of this vaccine but still declined. Advised may receive this vaccine at local pharmacy or Health Dept. Aware to provide a copy of the vaccination record if obtained from local pharmacy or Health Dept. Verbalized acceptance and understanding.  Due for Tdap vaccine. Education has been provided regarding the importance of this vaccine. Advised may receive this vaccine at local pharmacy or Health Dept. Aware to provide a copy of the vaccination record if obtained from local pharmacy or Health Dept. Verbalized acceptance and understanding.  Cancer Screenings: Lung: Low Dose CT Chest recommended if Age 7-80 years, 30 pack-year currently smoking OR have quit w/in 15years. Patient does not qualify. Breast Screening: No longer required   Bone Density/Dexa: No longer required Colorectal: No longer required  Additional Screenings: Hepatitis C Screening: No longer required   Plan:  I have personally reviewed and addressed the Medicare Annual Wellness questionnaire and have noted the following in the patient's chart:  A. Medical and  social history B. Use of alcohol, tobacco or illicit drugs  C. Current medications and supplements D. Functional ability and status E.  Nutritional status F.  Physical activity G. Advance directives H. List of other physicians I.  Hospitalizations, surgeries, and ER visits in previous 12 months J.  [place] such as hearing and vision  if needed, cognitive and depression L. Referrals and appointments  In addition, I have reviewed and discussed with patient certain preventive protocols, quality metrics, and best practice recommendations. A written personalized care plan for preventive services as well as general preventive health recommendations were provided to patient.  Signed,  Aleatha Borer, LPN Nurse Health Advisor  MD Recommendations: Due for Shingrix. Education has been provided regarding the importance of this vaccine. Pt has been advised to call insurance company to determine out of pocket expense. Advised may also receive vaccine at local pharmacy or Health Dept. Verbalized acceptance and understanding.  Overdue for Flu vaccine. Education has been provided regarding the importance of this vaccine and advised to receive when available. Verbalized acceptance and understanding.  Due for Pneumoccocal vaccine. Declined my offer to administer today. Education has been provided regarding the importance of this vaccine but still declined. Advised may receive this vaccine at local pharmacy or Health Dept. Aware to provide a copy of the vaccination record if obtained from local pharmacy or Health Dept. Verbalized acceptance and understanding.  Due for Tdap vaccine. Education has been provided regarding the importance of this vaccine. Advised may receive this vaccine at local pharmacy or Health Dept. Aware to provide a copy of the vaccination record if obtained from local pharmacy or Health Dept. Verbalized acceptance and understanding.  Community Resource Referral placed to Care  Guide for [employer]. Pt states she has had a history of falls by picking up items from the floor. May benefit from a reacher to reduce future risk for falls.

## 2017-12-21 NOTE — Progress Notes (Signed)
Bun

## 2017-12-21 NOTE — Patient Instructions (Addendum)
Brittany Parks , Thank you for taking time to come for your Medicare Wellness Visit. I appreciate your ongoing commitment to your health goals. Please review the following plan we discussed and let me know if I can assist you in the future.   Screening recommendations/referrals: Colorectal Screening: No longer required Mammogram: No longer required Bone Density: No longer required  Vision and Dental Exams: Recommended annual ophthalmology exams for early detection of glaucoma and other disorders of the eye Recommended annual dental exams for proper oral hygiene  Vaccinations: Influenza vaccine: Overdue Pneumococcal vaccine: Declined Tdap vaccine: Declined. Please call your insurance company to determine your out of pocket expense. You may also receive this vaccine at your local pharmacy or Health Dept. Shingles vaccine: Please call your insurance company to determine your out of pocket expense for the Shingrix vaccine. You may also receive this vaccine at your local pharmacy or Health Dept.  Advanced directives: Please bring a copy of your POA (Power of Attorney) and/or Living Will to your next appointment.  Goals: Recommend to drink at least 6-8 8oz glasses of water per day.  Next appointment: Please schedule your Annual Wellness Visit with your Nurse Health Advisor in one year.  Preventive Care 11 Years and Older, Female Preventive care refers to lifestyle choices and visits with your health care provider that can promote health and wellness. What does preventive care include?  A yearly physical exam. This is also called an annual well check.  Dental exams once or twice a year.  Routine eye exams. Ask your health care provider how often you should have your eyes checked.  Personal lifestyle choices, including:  Daily care of your teeth and gums.  Regular physical activity.  Eating a healthy diet.  Avoiding tobacco and drug use.  Limiting alcohol use.  Practicing safe  sex.  Taking low-dose aspirin every day.  Taking vitamin and mineral supplements as recommended by your health care provider. What happens during an annual well check? The services and screenings done by your health care provider during your annual well check will depend on your age, overall health, lifestyle risk factors, and family history of disease. Counseling  Your health care provider may ask you questions about your:  Alcohol use.  Tobacco use.  Drug use.  Emotional well-being.  Home and relationship well-being.  Sexual activity.  Eating habits.  History of falls.  Memory and ability to understand (cognition).  Work and work Statistician.  Reproductive health. Screening  You may have the following tests or measurements:  Height, weight, and BMI.  Blood pressure.  Lipid and cholesterol levels. These may be checked every 5 years, or more frequently if you are over 31 years old.  Skin check.  Lung cancer screening. You may have this screening every year starting at age 66 if you have a 30-pack-year history of smoking and currently smoke or have quit within the past 15 years.  Fecal occult blood test (FOBT) of the stool. You may have this test every year starting at age 19.  Flexible sigmoidoscopy or colonoscopy. You may have a sigmoidoscopy every 5 years or a colonoscopy every 10 years starting at age 37.  Hepatitis C blood test.  Hepatitis B blood test.  Sexually transmitted disease (STD) testing.  Diabetes screening. This is done by checking your blood sugar (glucose) after you have not eaten for a while (fasting). You may have this done every 1-3 years.  Bone density scan. This is done to screen for osteoporosis.  You may have this done starting at age 43.  Mammogram. This may be done every 1-2 years. Talk to your health care provider about how often you should have regular mammograms. Talk with your health care provider about your test results,  treatment options, and if necessary, the need for more tests. Vaccines  Your health care provider may recommend certain vaccines, such as:  Influenza vaccine. This is recommended every year.  Tetanus, diphtheria, and acellular pertussis (Tdap, Td) vaccine. You may need a Td booster every 10 years.  Zoster vaccine. You may need this after age 40.  Pneumococcal 13-valent conjugate (PCV13) vaccine. One dose is recommended after age 52.  Pneumococcal polysaccharide (PPSV23) vaccine. One dose is recommended after age 61. Talk to your health care provider about which screenings and vaccines you need and how often you need them. This information is not intended to replace advice given to you by your health care provider. Make sure you discuss any questions you have with your health care provider. Document Released: 06/13/2015 Document Revised: 02/04/2016 Document Reviewed: 03/18/2015 Elsevier Interactive Patient Education  2017 [place] Prevention in the Home Falls can cause injuries. They can happen to people of all ages. There are many things you can do to make your home safe and to help prevent falls. What can I do on the outside of my home?  Regularly fix the edges of walkways and driveways and fix any cracks.  Remove anything that might make you trip as you walk through a door, such as a raised step or threshold.  Trim any bushes or trees on the path to your home.  Use bright outdoor lighting.  Clear any walking paths of anything that might make someone trip, such as rocks or tools.  Regularly check to see if handrails are loose or broken. Make sure that both sides of any steps have handrails.  Any raised decks and porches should have guardrails on the edges.  Have any leaves, snow, or ice cleared regularly.  Use sand or salt on walking paths during winter.  Clean up any spills in your garage right away. This includes oil or grease spills. What can I do in the  bathroom?  Use night lights.  Install grab bars by the toilet and in the tub and shower. Do not use towel bars as grab bars.  Use non-skid mats or decals in the tub or shower.  If you need to sit down in the shower, use a plastic, non-slip stool.  Keep the floor dry. Clean up any water that spills on the floor as soon as it happens.  Remove soap buildup in the tub or shower regularly.  Attach bath mats securely with double-sided non-slip rug tape.  Do not have throw rugs and other things on the floor that can make you trip. What can I do in the bedroom?  Use night lights.  Make sure that you have a light by your bed that is easy to reach.  Do not use any sheets or blankets that are too big for your bed. They should not hang down onto the floor.  Have a firm chair that has side arms. You can use this for support while you get dressed.  Do not have throw rugs and other things on the floor that can make you trip. What can I do in the kitchen?  Clean up any spills right away.  Avoid walking on wet floors.  Keep items that you use a  lot in easy-to-reach places.  If you need to reach something above you, use a strong step stool that has a grab bar.  Keep electrical cords out of the way.  Do not use floor polish or wax that makes floors slippery. If you must use wax, use non-skid floor wax.  Do not have throw rugs and other things on the floor that can make you trip. What can I do with my stairs?  Do not leave any items on the stairs.  Make sure that there are handrails on both sides of the stairs and use them. Fix handrails that are broken or loose. Make sure that handrails are as long as the stairways.  Check any carpeting to make sure that it is firmly attached to the stairs. Fix any carpet that is loose or worn.  Avoid having throw rugs at the top or bottom of the stairs. If you do have throw rugs, attach them to the floor with carpet tape.  Make sure that you have a  light switch at the top of the stairs and the bottom of the stairs. If you do not have them, ask someone to add them for you. What else can I do to help prevent falls?  Wear shoes that:  Do not have high heels.  Have rubber bottoms.  Are comfortable and fit you well.  Are closed at the toe. Do not wear sandals.  If you use a stepladder:  Make sure that it is fully opened. Do not climb a closed stepladder.  Make sure that both sides of the stepladder are locked into place.  Ask someone to hold it for you, if possible.  Clearly mark and make sure that you can see:  Any grab bars or handrails.  First and last steps.  Where the edge of each step is.  Use tools that help you move around (mobility aids) if they are needed. These include:  Canes.  Walkers.  Scooters.  Crutches.  Turn on the lights when you go into a dark area. Replace any light bulbs as soon as they burn out.  Set up your furniture so you have a clear path. Avoid moving your furniture around.  If any of your floors are uneven, fix them.  If there are any pets around you, be aware of where they are.  Review your medicines with your doctor. Some medicines can make you feel dizzy. This can increase your chance of falling. Ask your doctor what other things that you can do to help prevent falls. This information is not intended to replace advice given to you by your health care provider. Make sure you discuss any questions you have with your health care provider. Document Released: 03/13/2009 Document Revised: 10/23/2015 Document Reviewed: 06/21/2014 Elsevier Interactive Patient Education  2017 [employer].

## 2017-12-22 LAB — BUN+CREAT
BUN/Creatinine Ratio: 14 (ref 12–28)
BUN: 12 mg/dL (ref 8–27)
CREATININE: 0.88 mg/dL (ref 0.57–1.00)
GFR calc Af Amer: 69 mL/min/{1.73_m2} (ref >59)
GFR, EST NON AFRICAN AMERICAN: 60 mL/min/{1.73_m2} (ref >59)

## 2018-05-22 ENCOUNTER — Encounter: Payer: Self-pay | Admitting: Family Medicine

## 2018-05-22 ENCOUNTER — Ambulatory Visit: Payer: Medicare Other | Admitting: Family Medicine

## 2018-05-22 VITALS — BP 120/70 | HR 78 | Ht 62.0 in | Wt 193.0 lb

## 2018-05-22 DIAGNOSIS — I10 Essential (primary) hypertension: Secondary | ICD-10-CM | POA: Diagnosis not present

## 2018-05-22 DIAGNOSIS — H1045 Other chronic allergic conjunctivitis: Secondary | ICD-10-CM

## 2018-05-22 DIAGNOSIS — J432 Centrilobular emphysema: Secondary | ICD-10-CM

## 2018-05-22 MED ORDER — ALBUTEROL SULFATE HFA 108 (90 BASE) MCG/ACT IN AERS: 2.0000 | INHALATION_SPRAY | Freq: Four times a day (QID) | RESPIRATORY_TRACT | 3 refills | Status: DC | PRN

## 2018-05-22 MED ORDER — LOSARTAN POTASSIUM-HCTZ 50-12.5 MG PO TABS
1.0000 | ORAL_TABLET | Freq: Every day | ORAL | 1 refills | Status: DC
Start: 2018-05-22 — End: 2018-08-25

## 2018-05-22 MED ORDER — UMECLIDINIUM-VILANTEROL 62.5-25 MCG/INH IN AEPB: 1.0000 | INHALATION_SPRAY | Freq: Every day | RESPIRATORY_TRACT | 3 refills | Status: DC

## 2018-05-22 NOTE — Progress Notes (Signed)
Date:  05/22/2018   Name:  Brittany Parks   DOB:  1930-08-18   MRN:  [id number]   Chief Complaint: Hypertension and COPD  Hypertension  This is a chronic problem. The current episode started more than 1 year ago. The problem is unchanged. The problem is controlled. Pertinent negatives include no anxiety, blurred vision, chest pain, headaches, malaise/fatigue, neck pain, orthopnea, palpitations, peripheral edema, PND, shortness of breath or sweats. There are no associated agents to hypertension. Risk factors for coronary artery disease include dyslipidemia and obesity. Past treatments include angiotensin blockers and diuretics. The current treatment provides moderate improvement. There are no compliance problems.  There is no history of angina, kidney disease, CAD/MI, CVA, heart failure, left ventricular hypertrophy, PVD or retinopathy. There is no history of chronic renal disease, a hypertension causing med or renovascular disease.  COPD  There is no chest tightness, cough, difficulty breathing, frequent throat clearing, hemoptysis, hoarse voice, shortness of breath, sputum production or wheezing. This is a chronic problem. The current episode started more than 1 year ago. The problem occurs intermittently. The problem has been unchanged. Pertinent negatives include no appetite change, chest pain, dyspnea on exertion, ear congestion, ear pain, fever, headaches, heartburn, malaise/fatigue, myalgias, nasal congestion, orthopnea, PND, postnasal drip, rhinorrhea, sneezing, sore throat, sweats, trouble swallowing or weight loss. Her symptoms are aggravated by nothing. Her symptoms are alleviated by nothing. She reports moderate improvement on treatment. Her symptoms are not alleviated by beta-agonist and ipratropium. There are no known risk factors for lung disease. Her past medical history is significant for COPD. There is no history of asthma, bronchiectasis, bronchitis, emphysema or pneumonia.     Review of Systems  Constitutional: Negative.  Negative for appetite change, chills, fatigue, fever, malaise/fatigue, unexpected weight change and weight loss.  HENT: Negative for congestion, ear discharge, ear pain, hoarse voice, postnasal drip, rhinorrhea, sinus pressure, sneezing, sore throat and trouble swallowing.   Eyes: Positive for redness and itching. Negative for blurred vision, photophobia, pain and discharge.  Respiratory: Negative for cough, hemoptysis, sputum production, shortness of breath, wheezing and stridor.   Cardiovascular: Negative for chest pain, dyspnea on exertion, palpitations, orthopnea and PND.  Gastrointestinal: Negative for abdominal pain, blood in stool, constipation, diarrhea, heartburn, nausea and vomiting.  Endocrine: Negative for cold intolerance, heat intolerance, polydipsia, polyphagia and polyuria.  Genitourinary: Negative for dysuria, flank pain, frequency, hematuria, menstrual problem, pelvic pain, urgency, vaginal bleeding and vaginal discharge.  Musculoskeletal: Negative for arthralgias, back pain, myalgias and neck pain.  Skin: Negative for rash.  Allergic/Immunologic: Negative for environmental allergies and food allergies.  Neurological: Negative for dizziness, weakness, light-headedness, numbness and headaches.  Hematological: Negative for adenopathy. Does not bruise/bleed easily.  Psychiatric/Behavioral: Negative for dysphoric mood. The patient is not nervous/anxious.     Patient Active Problem List   Diagnosis Date Noted  . Centrilobular emphysema ([place]) 11/18/2017  . Hypoxia 08/04/2016  . Pyuria 03/05/2015  . Malignant neoplasm of overlapping sites of bladder ([place]) 02/05/2015  . Abdominal aortic aneurysm greater than 39 mm in diameter ([place]) 01/23/2015  . Adrenal mass, left ([place]) 01/23/2015  . Bladder neoplasm of uncertain malignant potential 01/23/2015  . Kidney filling defect 01/23/2015  . Lymphadenopathy, abdominal 01/23/2015  .  Urinary retention 01/15/2015  . Difficulty in urination 01/14/2015  . Gross hematuria 01/14/2015  . History of nephrolithiasis 01/14/2015  . Incomplete emptying of bladder 01/14/2015  . COPD exacerbation ([place]) 12/05/2014  . Essential hypertension 12/05/2014  . Hyperlipidemia 12/05/2014  Allergies  Allergen Reactions  . Other Other (See Comments), Itching and Swelling    Uncoded Allergy. Allergen: ivp dye Uncoded Allergy. Allergen: Shellfish Contraindicated due to shellfish allergy Uncoded Allergy. Allergen: Shellfish Uncoded Allergy. Allergen: ivp dye   . Penicillins Itching, Other (See Comments) and Swelling    Other Reaction: Other reaction   . Shellfish-Derived Products Itching and Swelling  . Azithromycin Rash    Past Surgical History:  Procedure Laterality Date  . BLADDER SURGERY    . BLADDER TUMOR EXCISION      Social History   Tobacco Use  . Smoking status: Former Smoker    Packs/day: 1.00    Years: 70.00    Pack years: 70.00    Types: Cigarettes    Last attempt to quit: 07/2017    Years since quitting: 0.8  . Smokeless tobacco: Never Used  . Tobacco comment: smoking cessation materials not required  Substance Use Topics  . Alcohol use: No    Alcohol/week: 0.0 standard drinks  . Drug use: No     Medication list has been reviewed and updated.  Current Meds  Medication Sig  . albuterol (PROVENTIL HFA;VENTOLIN HFA) 108 (90 Base) MCG/ACT inhaler Inhale 2 puffs into the lungs every 6 (six) hours as needed for wheezing or shortness of breath.  . losartan-hydrochlorothiazide (HYZAAR) 50-12.5 MG tablet Take 1 tablet by mouth daily.  Marland Kitchen umeclidinium-vilanterol (ANORO ELLIPTA) 62.5-25 MCG/INH AEPB Inhale 1 puff into the lungs daily.    PHQ 2/9 Scores 05/22/2018 12/21/2017 05/16/2017 12/04/2015  PHQ - 2 Score 0 0 0 0  PHQ- 9 Score 0 0 - -    Physical Exam Vitals signs and nursing note reviewed.  Constitutional:      General: She is not in acute  distress.    Appearance: She is not diaphoretic.  HENT:     Head: Normocephalic and atraumatic.     Right Ear: External ear normal.     Left Ear: External ear normal.     Nose: Nose normal.  Eyes:     General:        Right eye: No discharge.        Left eye: No discharge.     Conjunctiva/sclera: Conjunctivae normal.     Pupils: Pupils are equal, round, and reactive to light.  Neck:     Musculoskeletal: Normal range of motion and neck supple.     Thyroid: No thyromegaly.     Vascular: No JVD.  Cardiovascular:     Rate and Rhythm: Normal rate and regular rhythm.     Heart sounds: Normal heart sounds. No murmur. No friction rub. No gallop.   Pulmonary:     Effort: Pulmonary effort is normal.     Breath sounds: Normal breath sounds. No wheezing or rhonchi.  Abdominal:     General: Bowel sounds are normal.     Palpations: Abdomen is soft. There is no mass.     Tenderness: There is no abdominal tenderness. There is no guarding.  Musculoskeletal: Normal range of motion.  Lymphadenopathy:     Cervical: No cervical adenopathy.  Skin:    General: Skin is warm and dry.  Neurological:     Mental Status: She is alert.     Deep Tendon Reflexes: Reflexes are normal and symmetric.     BP 120/70   Pulse 78   Ht 5\' 2"  (1.575 m)   Wt 193 lb (87.5 kg)   SpO2 95%   BMI 35.30 kg/m  Assessment and Plan:  1. Centrilobular emphysema (HCC) Chronic.  Stable.  Intermittent.  Continue albuterol inhaler 2 puffs every 6 hours and Anoro C2 0.5-25 mcg puff daily. - albuterol (PROVENTIL HFA;VENTOLIN HFA) 108 (90 Base) MCG/ACT inhaler; Inhale 2 puffs into the lungs every 6 (six) hours as needed for wheezing or shortness of breath.  Dispense: 3 Inhaler; Refill: 3 - umeclidinium-vilanterol (ANORO ELLIPTA) 62.5-25 MCG/INH AEPB; Inhale 1 puff into the lungs daily.  Dispense: 3 each; Refill: 3  2. Essential hypertension Chronic.  Controlled.  Continue losartan hydrochlorothiazide 50-12 0.5. -  losartan-hydrochlorothiazide (HYZAAR) 50-12.5 MG tablet; Take 1 tablet by mouth daily.  Dispense: 90 tablet; Refill: 1  3. Other chronic allergic conjunctivitis of both eyes New onset.  Persistent.  Patient notes that she has to wipe eyes recurrently during the day suggested that she may have Carretta conjunctivitis/dry eyes jested trial of liquid tears. Referral to ophthalmology for evaluation of dry eyes.

## 2018-05-22 NOTE — Patient Instructions (Addendum)
Dry Eye  Dry eye, also called keratoconjunctivitis sicca, is dryness of the membranes surrounding the eye. It happens when there are not enough healthy, natural tears in the eyes. The eyes must remain moist at all times. A small amount of tears is constantly produced by the tear glands (lacrimal glands). These glands are located under the outside part of the upper eyelids. Dry eye can happen on its own or be a symptom of several conditions, such as rheumatoid arthritis, lupus, or Sjgren's syndrome. Dry eye may be mild to severe. What are the causes? This condition may be caused by:  Not making enough tears (aqueous tear-deficient dry eyes).  Tears evaporating from the eyes too quickly (evaporative dry eyes). This is when there is an abnormality in the quality of your tears. This abnormality causes your tears to evaporate so quickly that the eyes cannot be kept moist. What increases the risk? You are more likely to develop this condition if you:  Are a woman, especially if you have gone through menopause.  Live in a dry climate.  Live in a dusty or smoky area.  Take certain medicines, such as: ? Anti-allergy medicines (antihistamines). ? Blood pressure medicines (antihypertensives). ? Birth control pills (oral contraceptives). ? Laxatives. ? Tranquilizers.  Have a history of refractive eye surgery, such as LASIK.  Have a history of long-term contact lens use. What are the signs or symptoms? Symptoms of this condition include:  Irritation.  Itchiness.  Redness.  Burning.  Inflammation of the eyelids.  Feeling as though something is stuck in the eye.  Light sensitivity.  Increased sensitivity and discomfort when wearing contact lenses.  Vision that varies throughout the day.  Occasional excessive tearing. How is this diagnosed? This condition is diagnosed based on your symptoms, your medical history, and an eye exam.  Your health care provider may look at your eye  using a microscope and may put dyes in your eye to check the health of the surface of your eye.  You may have tests, such as a test to evaluate your tear production (Schirmer test). ? During this test, a small strip of special paper is gently pressed into the inner corner of your eye. ? Your tear production is measured by how much of the paper is moistened by your tears during a set amount of time. You may be referred to a health care provider who specializes in eyes and eyesight (ophthalmologist). How is this treated? Treatment for this condition depends on the severity. Mild cases are often treated at home. To help relieve your symptoms, your health care provider may recommend eye drops, which are also called artificial tears.  If your condition is severe, treatment may include: ? Prescription eye drops. ? Over-the-counter or prescription ointments to moisten your eyes. ? Minor surgery to place plugs into the tear ducts. This keep tears from draining so that tears can stay on the surface of the eye longer. ? Medicines to reduce inflammation of the eyelids. ? Taking an omega-3 fatty acid nutritional supplement. Follow these instructions at home:  Take or apply over-the-counter and prescription medicines only as told by your health care provider. This includes eye drops.  If directed, apply a warm compress to your eyes to help reduce inflammation. Place a towel over your eyes and gently press the warm compress over your eyes for about 5 minutes, or as long as told by your health care provider.  Drink plenty of fluids to stay well hydrated.  If   possible, avoid dry, drafty environments.  Wear sunglasses when outdoors to protect your eyes from the sun and wind.  Use a humidifier at home to increase moisture in the air.  Remember to blink often when reading or using the computer for long periods.  If you wear contact lenses, remove them regularly to give your eyes a break. Always remove  contacts before sleeping.  Have a yearly eye exam and vision test.  Keep all follow-up visits as told by your health care provider. This is important. Contact a health care provider if:  You have eye pain.  You have pus-like fluid coming from your eye.  Your symptoms get worse or do not improve with treatment. Get help right away if:  Your vision suddenly changes. Summary  Dry eye is dryness of the membranes surrounding the eye.  Dry eye can happen on its own or be a symptom of several conditions, such as rheumatoid arthritis, lupus, or Sjgren's syndrome.  This condition is diagnosed based on your symptoms, your medical history, and an eye exam.  Treatment for this condition depends on the severity. Mild cases are often treated at home. To help relieve your symptoms, your health care provider may recommend eye drops, which are also called artificial tears. This information is not intended to replace advice given to you by your health care provider. Make sure you discuss any questions you have with your health care provider. Document Released: 04/03/2004 Document Revised: 11/08/2017 Document Reviewed: 11/08/2017 Elsevier Interactive Patient Education  2019 [employer]  Dry Eye  Dry eye, also called keratoconjunctivitis sicca, is dryness of the membranes surrounding the eye. It happens when there are not enough healthy, natural tears in the eyes. The eyes must remain moist at all times. A small amount of tears is constantly produced by the tear glands (lacrimal glands). These glands are located under the outside part of the upper eyelids. Dry eye can happen on its own or be a symptom of several conditions, such as rheumatoid arthritis, lupus, or Sjgren's syndrome. Dry eye may be mild to severe. What are the causes? This condition may be caused by:  Not making enough tears (aqueous tear-deficient dry eyes).  Tears evaporating from the eyes too quickly (evaporative dry eyes).  This is when there is an abnormality in the quality of your tears. This abnormality causes your tears to evaporate so quickly that the eyes cannot be kept moist. What increases the risk? You are more likely to develop this condition if you:  Are a woman, especially if you have gone through menopause.  Live in a dry climate.  Live in a dusty or smoky area.  Take certain medicines, such as: ? Anti-allergy medicines (antihistamines). ? Blood pressure medicines (antihypertensives). ? Birth control pills (oral contraceptives). ? Laxatives. ? Tranquilizers.  Have a history of refractive eye surgery, such as LASIK.  Have a history of long-term contact lens use. What are the signs or symptoms? Symptoms of this condition include:  Irritation.  Itchiness.  Redness.  Burning.  Inflammation of the eyelids.  Feeling as though something is stuck in the eye.  Light sensitivity.  Increased sensitivity and discomfort when wearing contact lenses.  Vision that varies throughout the day.  Occasional excessive tearing. How is this diagnosed? This condition is diagnosed based on your symptoms, your medical history, and an eye exam.  Your health care provider may look at your eye using a microscope and may put dyes in your eye to check the  health of the surface of your eye.  You may have tests, such as a test to evaluate your tear production (Schirmer test). ? During this test, a small strip of special paper is gently pressed into the inner corner of your eye. ? Your tear production is measured by how much of the paper is moistened by your tears during a set amount of time. You may be referred to a health care provider who specializes in eyes and eyesight (ophthalmologist). How is this treated? Treatment for this condition depends on the severity. Mild cases are often treated at home. To help relieve your symptoms, your health care provider may recommend eye drops, which are also called  artificial tears.  If your condition is severe, treatment may include: ? Prescription eye drops. ? Over-the-counter or prescription ointments to moisten your eyes. ? Minor surgery to place plugs into the tear ducts. This keep tears from draining so that tears can stay on the surface of the eye longer. ? Medicines to reduce inflammation of the eyelids. ? Taking an omega-3 fatty acid nutritional supplement. Follow these instructions at home:  Take or apply over-the-counter and prescription medicines only as told by your health care provider. This includes eye drops.  If directed, apply a warm compress to your eyes to help reduce inflammation. Place a towel over your eyes and gently press the warm compress over your eyes for about 5 minutes, or as long as told by your health care provider.  Drink plenty of fluids to stay well hydrated.  If possible, avoid dry, drafty environments.  Wear sunglasses when outdoors to protect your eyes from the sun and wind.  Use a humidifier at home to increase moisture in the air.  Remember to blink often when reading or using the computer for long periods.  If you wear contact lenses, remove them regularly to give your eyes a break. Always remove contacts before sleeping.  Have a yearly eye exam and vision test.  Keep all follow-up visits as told by your health care provider. This is important. Contact a health care provider if:  You have eye pain.  You have pus-like fluid coming from your eye.  Your symptoms get worse or do not improve with treatment. Get help right away if:  Your vision suddenly changes. Summary  Dry eye is dryness of the membranes surrounding the eye.  Dry eye can happen on its own or be a symptom of several conditions, such as rheumatoid arthritis, lupus, or Sjgren's syndrome.  This condition is diagnosed based on your symptoms, your medical history, and an eye exam.  Treatment for this condition depends on the  severity. Mild cases are often treated at home. To help relieve your symptoms, your health care provider may recommend eye drops, which are also called artificial tears. This information is not intended to replace advice given to you by your health care provider. Make sure you discuss any questions you have with your health care provider. Document Released: 04/03/2004 Document Revised: 11/08/2017 Document Reviewed: 11/08/2017 Elsevier Interactive Patient Education  [employer].

## 2018-08-25 ENCOUNTER — Other Ambulatory Visit: Payer: Self-pay

## 2018-08-25 DIAGNOSIS — I10 Essential (primary) hypertension: Secondary | ICD-10-CM

## 2018-08-25 MED ORDER — LOSARTAN POTASSIUM-HCTZ 50-12.5 MG PO TABS: 1.0000 | ORAL_TABLET | Freq: Every day | ORAL | 0 refills | Status: DC

## 2018-08-29 ENCOUNTER — Other Ambulatory Visit: Payer: Self-pay

## 2018-08-29 MED ORDER — TELMISARTAN-HCTZ 40-12.5 MG PO TABS: 1.0000 | ORAL_TABLET | Freq: Every day | ORAL | 0 refills | Status: DC

## 2018-08-29 NOTE — Progress Notes (Unsigned)
Change to micardis/ HCTZ 40/12.5mg 

## 2018-09-07 ENCOUNTER — Other Ambulatory Visit: Payer: Self-pay

## 2018-09-07 DIAGNOSIS — J432 Centrilobular emphysema: Secondary | ICD-10-CM

## 2018-09-07 MED ORDER — UMECLIDINIUM-VILANTEROL 62.5-25 MCG/INH IN AEPB: 1.0000 | INHALATION_SPRAY | Freq: Every day | RESPIRATORY_TRACT | 0 refills | Status: DC

## 2018-11-11 ENCOUNTER — Other Ambulatory Visit: Payer: Self-pay | Admitting: Family Medicine

## 2018-11-11 DIAGNOSIS — I10 Essential (primary) hypertension: Secondary | ICD-10-CM

## 2018-11-21 ENCOUNTER — Ambulatory Visit: Payer: Self-pay | Admitting: Family Medicine

## 2018-11-28 ENCOUNTER — Encounter: Payer: Self-pay | Admitting: Family Medicine

## 2018-11-28 ENCOUNTER — Other Ambulatory Visit: Payer: Self-pay

## 2018-11-28 ENCOUNTER — Ambulatory Visit (INDEPENDENT_AMBULATORY_CARE_PROVIDER_SITE_OTHER): Payer: Medicare Other | Admitting: Family Medicine

## 2018-11-28 VITALS — BP 130/80 | HR 72 | Ht 62.0 in | Wt 172.0 lb

## 2018-11-28 DIAGNOSIS — J432 Centrilobular emphysema: Secondary | ICD-10-CM | POA: Diagnosis not present

## 2018-11-28 DIAGNOSIS — I10 Essential (primary) hypertension: Secondary | ICD-10-CM

## 2018-11-28 DIAGNOSIS — E782 Mixed hyperlipidemia: Secondary | ICD-10-CM | POA: Diagnosis not present

## 2018-11-28 MED ORDER — TELMISARTAN-HCTZ 40-12.5 MG PO TABS: 1.0000 | ORAL_TABLET | Freq: Every day | ORAL | 1 refills | Status: DC

## 2018-11-28 MED ORDER — UMECLIDINIUM-VILANTEROL 62.5-25 MCG/INH IN AEPB: 1.0000 | INHALATION_SPRAY | Freq: Every day | RESPIRATORY_TRACT | 1 refills | Status: DC

## 2018-11-28 NOTE — Progress Notes (Signed)
Date:  11/28/2018   Name:  Brittany Parks   DOB:  [birth year]   MRN:  [id number]   Chief Complaint: COPD and Hypertension  COPD She complains of wheezing. There is no chest tightness, cough, difficulty breathing, frequent throat clearing, hemoptysis, hoarse voice, shortness of breath or sputum production. This is a chronic problem. The current episode started more than 1 year ago. The problem occurs intermittently. The problem has been waxing and waning. Pertinent negatives include no appetite change, chest pain, dyspnea on exertion, ear congestion, ear pain, fever, headaches, heartburn, malaise/fatigue, myalgias, nasal congestion, orthopnea, PND, postnasal drip, rhinorrhea, sneezing, sore throat, sweats, trouble swallowing or weight loss. Her symptoms are aggravated by change in weather. Her symptoms are alleviated by beta-agonist and ipratropium. She reports moderate improvement on treatment. Her past medical history is significant for COPD.  Hypertension This is a chronic problem. The current episode started more than 1 year ago. The problem is unchanged. The problem is controlled. Pertinent negatives include no anxiety, blurred vision, chest pain, headaches, malaise/fatigue, neck pain, orthopnea, palpitations, peripheral edema, PND, shortness of breath or sweats. There are no associated agents to hypertension. Risk factors for coronary artery disease include dyslipidemia. Past treatments include angiotensin blockers. The current treatment provides mild improvement. There are no compliance problems.  There is no history of angina, kidney disease, CAD/MI, CVA, heart failure, left ventricular hypertrophy or PVD. There is no history of chronic renal disease, a hypertension causing med or renovascular disease.    Review of Systems  Constitutional: Negative for appetite change, chills, fever, malaise/fatigue and weight loss.  HENT: Negative for drooling, ear discharge, ear pain, hoarse voice,  postnasal drip, rhinorrhea, sneezing, sore throat and trouble swallowing.   Eyes: Negative for blurred vision.  Respiratory: Positive for wheezing. Negative for cough, hemoptysis, sputum production and shortness of breath.   Cardiovascular: Negative for chest pain, dyspnea on exertion, palpitations, orthopnea, leg swelling and PND.  Gastrointestinal: Negative for abdominal pain, blood in stool, constipation, diarrhea, heartburn and nausea.  Endocrine: Negative for polydipsia.  Genitourinary: Negative for dysuria, frequency, hematuria and urgency.  Musculoskeletal: Negative for back pain, myalgias and neck pain.  Skin: Negative for rash.  Allergic/Immunologic: Negative for environmental allergies.  Neurological: Negative for dizziness and headaches.  Hematological: Does not bruise/bleed easily.  Psychiatric/Behavioral: Negative for suicidal ideas. The patient is not nervous/anxious.     Patient Active Problem List   Diagnosis Date Noted  . Centrilobular emphysema ([place]) 11/18/2017  . Hypoxia 08/04/2016  . Pyuria 03/05/2015  . Malignant neoplasm of overlapping sites of bladder ([place]) 02/05/2015  . Abdominal aortic aneurysm greater than 39 mm in diameter ([place]) 01/23/2015  . Adrenal mass, left ([place]) 01/23/2015  . Bladder neoplasm of uncertain malignant potential 01/23/2015  . Kidney filling defect 01/23/2015  . Lymphadenopathy, abdominal 01/23/2015  . Urinary retention 01/15/2015  . Difficulty in urination 01/14/2015  . Gross hematuria 01/14/2015  . History of nephrolithiasis 01/14/2015  . Incomplete emptying of bladder 01/14/2015  . COPD exacerbation ([place]) 12/05/2014  . Essential hypertension 12/05/2014  . Hyperlipidemia 12/05/2014    Allergies  Allergen Reactions  . Other Other (See Comments), Itching and Swelling    Uncoded Allergy. Allergen: ivp dye Uncoded Allergy. Allergen: Shellfish Contraindicated due to shellfish allergy Uncoded Allergy. Allergen: Shellfish Uncoded  Allergy. Allergen: ivp dye   . Penicillins Itching, Other (See Comments) and Swelling    Other Reaction: Other reaction   . Shellfish-Derived Products Itching and Swelling  . Azithromycin  Rash    Past Surgical History:  Procedure Laterality Date  . BLADDER SURGERY    . BLADDER TUMOR EXCISION      Social History   Tobacco Use  . Smoking status: Former Smoker    Packs/day: 1.00    Years: 70.00    Pack years: 70.00    Types: Cigarettes    Quit date: 07/2017    Years since quitting: 1.3  . Smokeless tobacco: Never Used  . Tobacco comment: smoking cessation materials not required  Substance Use Topics  . Alcohol use: No    Alcohol/week: 0.0 standard drinks  . Drug use: No     Medication list has been reviewed and updated.  Current Meds  Medication Sig  . albuterol (PROVENTIL HFA;VENTOLIN HFA) 108 (90 Base) MCG/ACT inhaler Inhale 2 puffs into the lungs every 6 (six) hours as needed for wheezing or shortness of breath.  . telmisartan-hydrochlorothiazide (MICARDIS HCT) 40-12.5 MG tablet Take 1 tablet by mouth daily.  Marland Kitchen umeclidinium-vilanterol (ANORO ELLIPTA) 62.5-25 MCG/INH AEPB Inhale 1 puff into the lungs daily.    PHQ 2/9 Scores 11/28/2018 05/22/2018 12/21/2017 05/16/2017  PHQ - 2 Score 0 0 0 0  PHQ- 9 Score 0 0 0 -    BP Readings from Last 3 Encounters:  11/28/18 130/80  05/22/18 120/70  12/21/17 132/64    Physical Exam Vitals signs and nursing note reviewed.  Constitutional:      General: She is not in acute distress.    Appearance: She is not diaphoretic.  HENT:     Head: Normocephalic and atraumatic.     Right Ear: Tympanic membrane, ear canal and external ear normal. There is no impacted cerumen.     Left Ear: Tympanic membrane, ear canal and external ear normal. There is no impacted cerumen.     Nose: Nose normal. No congestion or rhinorrhea.     Mouth/Throat:     Mouth: Mucous membranes are moist.  Eyes:     General:        Right eye: No discharge.         Left eye: No discharge.     Conjunctiva/sclera: Conjunctivae normal.     Pupils: Pupils are equal, round, and reactive to light.  Neck:     Musculoskeletal: Normal range of motion and neck supple.     Thyroid: No thyromegaly.     Vascular: No JVD.  Cardiovascular:     Rate and Rhythm: Normal rate and regular rhythm.     Heart sounds: Normal heart sounds. No murmur. No friction rub. No gallop.   Pulmonary:     Effort: Pulmonary effort is normal.     Breath sounds: Normal breath sounds and air entry. No transmitted upper airway sounds. No decreased breath sounds, wheezing, rhonchi or rales.  Chest:     Chest wall: No tenderness.  Abdominal:     General: Abdomen is flat. Bowel sounds are normal.     Palpations: Abdomen is soft. There is no mass.     Tenderness: There is no abdominal tenderness. There is no guarding.  Musculoskeletal: Normal range of motion.  Lymphadenopathy:     Cervical: No cervical adenopathy.  Skin:    General: Skin is warm and dry.  Neurological:     Mental Status: She is alert.     Deep Tendon Reflexes: Reflexes are normal and symmetric.     Wt Readings from Last 3 Encounters:  11/28/18 172 lb (78 kg)  05/22/18 193 lb (  87.5 kg)  12/21/17 193 lb 12.8 oz (87.9 kg)    BP 130/80   Pulse 72   Ht 5\' 2"  (1.575 m)   Wt 172 lb (78 kg)   SpO2 92%   BMI 31.46 kg/m   Assessment and Plan: 1. Centrilobular emphysema (HCC) Chronic.  Controlled.  Patient is currently on Anoro 1 puff q. a.m.  Patient was given samples and refills were placed.  Patient also uses albuterol inhaler for rescue purposes and we have encouraged for her to continue to do so particularly during the changes. - umeclidinium-vilanterol (ANORO ELLIPTA) 62.5-25 MCG/INH AEPB; Inhale 1 puff into the lungs daily.  Dispense: 3 each; Refill: 1  2. Essential hypertension Chronic.  Controlled.  Continue telmisartan hydrochlorothiazide.  Will check renal function panel to assess GFR. - Renal  Function Panel - telmisartan-hydrochlorothiazide (MICARDIS HCT) 40-12.5 MG tablet; Take 1 tablet by mouth daily.  Dispense: 90 tablet; Refill: 1  3. Moderate mixed hyperlipidemia not requiring statin therapy Patient with elevated triglycerides in the past.  Will check current status with lipid panel and provide Mediterranean diet patient is interested in these guidelines which she is for the most part following at this time. - Lipid Panel With LDL/HDL Ratio

## 2018-11-28 NOTE — Patient Instructions (Signed)
Why follow it? Research shows. . Those who follow the Mediterranean diet have a reduced risk of heart disease  . The diet is associated with a reduced incidence of Parkinson's and Alzheimer's diseases . People following the diet may have longer life expectancies and lower rates of chronic diseases  . The Dietary Guidelines for Americans recommends the Mediterranean diet as an eating plan to promote health and prevent disease  What Is the Mediterranean Diet?  . Healthy eating plan based on typical foods and recipes of Mediterranean-style cooking . The diet is primarily a plant based diet; these foods should make up a majority of meals   Starches - Plant based foods should make up a majority of meals - They are an important sources of vitamins, minerals, energy, antioxidants, and fiber - Choose whole grains, foods high in fiber and minimally processed items  - Typical grain sources include wheat, oats, barley, corn, brown rice, bulgar, farro, millet, polenta, couscous  - Various types of beans include chickpeas, lentils, fava beans, black beans, white beans   Fruits  Veggies - Large quantities of antioxidant rich fruits & veggies; 6 or more servings  - Vegetables can be eaten raw or lightly drizzled with oil and cooked  - Vegetables common to the traditional Mediterranean Diet include: artichokes, arugula, beets, broccoli, brussel sprouts, cabbage, carrots, celery, collard greens, cucumbers, eggplant, kale, leeks, lemons, lettuce, mushrooms, okra, onions, peas, peppers, potatoes, pumpkin, radishes, rutabaga, shallots, spinach, sweet potatoes, turnips, zucchini - Fruits common to the Mediterranean Diet include: apples, apricots, avocados, cherries, clementines, dates, figs, grapefruits, grapes, melons, nectarines, oranges, peaches, pears, pomegranates, strawberries, tangerines  Fats - Replace butter and margarine with healthy oils, such as olive oil, canola oil, and tahini  - Limit nuts to no  more than a handful a day  - Nuts include walnuts, almonds, pecans, pistachios, pine nuts  - Limit or avoid candied, honey roasted or heavily salted nuts - Olives are central to the Mediterranean diet - can be eaten whole or used in a variety of dishes   Meats Protein - Limiting red meat: no more than a few times a month - When eating red meat: choose lean cuts and keep the portion to the size of deck of cards - Eggs: approx. 0 to 4 times a week  - Fish and lean poultry: at least 2 a week  - Healthy protein sources include, chicken, turkey, lean beef, lamb - Increase intake of seafood such as tuna, salmon, trout, mackerel, shrimp, scallops - Avoid or limit high fat processed meats such as sausage and bacon  Dairy - Include moderate amounts of low fat dairy products  - Focus on healthy dairy such as fat free yogurt, skim milk, low or reduced fat cheese - Limit dairy products higher in fat such as whole or 2% milk, cheese, ice cream  Alcohol - Moderate amounts of red wine is ok  - No more than 5 oz daily for women (all ages) and men older than age 65  - No more than 10 oz of wine daily for men younger than 65  Other - Limit sweets and other desserts  - Use herbs and spices instead of salt to flavor foods  - Herbs and spices common to the traditional Mediterranean Diet include: basil, bay leaves, chives, cloves, cumin, fennel, garlic, lavender, marjoram, mint, oregano, parsley, pepper, rosemary, sage, savory, sumac, tarragon, thyme   It's not just a diet, it's a lifestyle:  . The Mediterranean diet includes   lifestyle factors typical of those in the region  . Foods, drinks and meals are best eaten with others and savored . Daily physical activity is important for overall good health . This could be strenuous exercise like running and aerobics . This could also be more leisurely activities such as walking, housework, yard-work, or taking the stairs . Moderation is the key; a balanced and  healthy diet accommodates most foods and drinks . Consider portion sizes and frequency of consumption of certain foods   Meal Ideas & Options:  . Breakfast:  o Whole wheat toast or whole wheat English muffins with peanut butter & hard boiled egg o Steel cut oats topped with apples & cinnamon and skim milk  o Fresh fruit: banana, strawberries, melon, berries, peaches  o Smoothies: strawberries, bananas, greek yogurt, peanut butter o Low fat greek yogurt with blueberries and granola  o Egg white omelet with spinach and mushrooms o Breakfast couscous: whole wheat couscous, apricots, skim milk, cranberries  . Sandwiches:  o Hummus and grilled vegetables (peppers, zucchini, squash) on whole wheat bread   o Grilled chicken on whole wheat pita with lettuce, tomatoes, cucumbers or tzatziki  o Tuna salad on whole wheat bread: tuna salad made with greek yogurt, olives, red peppers, capers, green onions o Garlic rosemary lamb pita: lamb sauted with garlic, rosemary, salt & pepper; add lettuce, cucumber, greek yogurt to pita - flavor with lemon juice and black pepper  . Seafood:  o Mediterranean grilled salmon, seasoned with garlic, basil, parsley, lemon juice and black pepper o Shrimp, lemon, and spinach whole-grain pasta salad made with low fat greek yogurt  o Seared scallops with lemon orzo  o Seared tuna steaks seasoned salt, pepper, coriander topped with tomato mixture of olives, tomatoes, olive oil, minced garlic, parsley, green onions and cappers  . Meats:  o Herbed greek chicken salad with kalamata olives, cucumber, feta  o Red bell peppers stuffed with spinach, bulgur, lean ground beef (or lentils) & topped with feta   o Kebabs: skewers of chicken, tomatoes, onions, zucchini, squash  o [place] burgers: made with red onions, mint, dill, lemon juice, feta cheese topped with roasted red peppers . Vegetarian o Cucumber salad: cucumbers, artichoke hearts, celery, red onion, feta cheese, tossed in  olive oil & lemon juice  o Hummus and whole grain pita points with a greek salad (lettuce, tomato, feta, olives, cucumbers, red onion) o Lentil soup with celery, carrots made with vegetable broth, garlic, salt and pepper  o Tabouli salad: parsley, bulgur, mint, scallions, cucumbers, tomato, radishes, lemon juice, olive oil, salt and pepper.     Mediterranean Diet A Mediterranean diet refers to food and lifestyle choices that are based on the traditions of countries located on the [employer]. This way of eating has been shown to help prevent certain conditions and improve outcomes for people who have chronic diseases, like kidney disease and heart disease. What are tips for following this plan? Lifestyle  Cook and eat meals together with your family, when possible.  Drink enough fluid to keep your urine clear or pale yellow.  Be physically active every day. This includes: ? Aerobic exercise like running or swimming. ? Leisure activities like gardening, walking, or housework.  Get 7-8 hours of sleep each night.  If recommended by your health care provider, drink red wine in moderation. This means 1 glass a day for nonpregnant women and 2 glasses a day for men. A glass of wine equals 5 oz (150  mL). Reading food labels   Check the serving size of packaged foods. For foods such as rice and pasta, the serving size refers to the amount of cooked product, not dry.  Check the total fat in packaged foods. Avoid foods that have saturated fat or trans fats.  Check the ingredients list for added sugars, such as corn syrup. Shopping  At the grocery store, buy most of your food from the areas near the walls of the store. This includes: ? Fresh fruits and vegetables (produce). ? Grains, beans, nuts, and seeds. Some of these may be available in unpackaged forms or large amounts (in bulk). ? Fresh seafood. ? Poultry and eggs. ? Low-fat dairy products.  Buy whole ingredients instead of  prepackaged foods.  Buy fresh fruits and vegetables in-season from local farmers markets.  Buy frozen fruits and vegetables in resealable bags.  If you do not have access to quality fresh seafood, buy precooked frozen shrimp or canned fish, such as tuna, salmon, or sardines.  Buy small amounts of raw or cooked vegetables, salads, or olives from the deli or salad bar at your store.  Stock your pantry so you always have certain foods on hand, such as olive oil, canned tuna, canned tomatoes, rice, pasta, and beans. Cooking  Cook foods with extra-virgin olive oil instead of using butter or other vegetable oils.  Have meat as a side dish, and have vegetables or grains as your main dish. This means having meat in small portions or adding small amounts of meat to foods like pasta or stew.  Use beans or vegetables instead of meat in common dishes like chili or lasagna.  Experiment with different cooking methods. Try roasting or broiling vegetables instead of steaming or sauteing them.  Add frozen vegetables to soups, stews, pasta, or rice.  Add nuts or seeds for added healthy fat at each meal. You can add these to yogurt, salads, or vegetable dishes.  Marinate fish or vegetables using olive oil, lemon juice, garlic, and fresh herbs. Meal planning   Plan to eat 1 vegetarian meal one day each week. Try to work up to 2 vegetarian meals, if possible.  Eat seafood 2 or more times a week.  Have healthy snacks readily available, such as: ? Vegetable sticks with hummus. ? [place] yogurt. ? Fruit and nut trail mix.  Eat balanced meals throughout the week. This includes: ? Fruit: 2-3 servings a day ? Vegetables: 4-5 servings a day ? Low-fat dairy: 2 servings a day ? Fish, poultry, or lean meat: 1 serving a day ? Beans and legumes: 2 or more servings a week ? Nuts and seeds: 1-2 servings a day ? Whole grains: 6-8 servings a day ? Extra-virgin olive oil: 3-4 servings a day  Limit red meat  and sweets to only a few servings a month What are my food choices?  Mediterranean diet ? Recommended  Grains: Whole-grain pasta. Brown rice. Bulgar wheat. Polenta. Couscous. Whole-wheat bread. [place].  Vegetables: Artichokes. Beets. Broccoli. Cabbage. Carrots. Eggplant. Green beans. Chard. Kale. Spinach. Onions. Leeks. Peas. Squash. Tomatoes. Peppers. Radishes.  Fruits: Apples. Apricots. Avocado. Berries. Bananas. Cherries. Dates. Figs. Grapes. Lemons. Melon. Oranges. Peaches. Plums. Pomegranate.  Meats and other protein foods: Beans. Almonds. Sunflower seeds. Pine nuts. Peanuts. [place]. Salmon. Scallops. Shrimp. [place]. Tilapia. Clams. Oysters. Eggs.  Dairy: Low-fat milk. Cheese. Greek yogurt.  Beverages: Water. Red wine. Herbal tea.  Fats and oils: Extra virgin olive oil. Avocado oil. Grape seed oil.  Sweets and  desserts: Greek yogurt with honey. Baked apples. Poached pears. Trail mix.  Seasoning and other foods: Basil. Cilantro. Coriander. Cumin. Mint. Parsley. Sage. Rosemary. Tarragon. Garlic. Oregano. Thyme. Pepper. Balsalmic vinegar. Tahini. Hummus. Tomato sauce. Olives. Mushrooms. ? Limit these  Grains: Prepackaged pasta or rice dishes. Prepackaged cereal with added sugar.  Vegetables: Deep fried potatoes (french fries).  Fruits: Fruit canned in syrup.  Meats and other protein foods: Beef. Pork. Lamb. Poultry with skin. Hot dogs. Berniece Salines.  Dairy: Ice cream. Sour cream. Whole milk.  Beverages: Juice. Sugar-sweetened soft drinks. Beer. Liquor and spirits.  Fats and oils: Butter. Canola oil. Vegetable oil. Beef fat (tallow). Lard.  Sweets and desserts: Cookies. Cakes. Pies. Candy.  Seasoning and other foods: Mayonnaise. Premade sauces and marinades. The items listed may not be a complete list. Talk with your dietitian about what dietary choices are right for you. Summary  The Mediterranean diet includes both food and lifestyle choices.  Eat a variety of fresh  fruits and vegetables, beans, nuts, seeds, and whole grains.  Limit the amount of red meat and sweets that you eat.  Talk with your health care provider about whether it is safe for you to drink red wine in moderation. This means 1 glass a day for nonpregnant women and 2 glasses a day for men. A glass of wine equals 5 oz (150 mL). This information is not intended to replace advice given to you by your health care provider. Make sure you discuss any questions you have with your health care provider. Document Released: 01/08/2016 Document Revised: 01/15/2016 Document Reviewed: 01/08/2016 Elsevier Patient Education  2020 [employer].

## 2018-11-29 LAB — LIPID PANEL WITH LDL/HDL RATIO
Cholesterol, Total: 233 mg/dL — ABNORMAL HIGH (ref 100–199)
HDL: 42 mg/dL (ref >39)
LDL Calculated: 155 mg/dL — ABNORMAL HIGH (ref 0–99)
LDl/HDL Ratio: 3.7 ratio — ABNORMAL HIGH (ref 0.0–3.2)
Triglycerides: 180 mg/dL — ABNORMAL HIGH (ref 0–149)
VLDL Cholesterol Cal: 36 mg/dL (ref 5–40)

## 2018-11-29 LAB — RENAL FUNCTION PANEL
Albumin: 4.6 g/dL (ref 3.6–4.6)
BUN/Creatinine Ratio: 15 (ref 12–28)
BUN: 12 mg/dL (ref 8–27)
CO2: 25 mmol/L (ref 20–29)
Calcium: 10 mg/dL (ref 8.7–10.3)
Chloride: 98 mmol/L (ref 96–106)
Creatinine, Ser: 0.82 mg/dL (ref 0.57–1.00)
GFR calc Af Amer: 75 mL/min/{1.73_m2} (ref >59)
GFR calc non Af Amer: 65 mL/min/{1.73_m2} (ref >59)
Glucose: 82 mg/dL (ref 65–99)
Phosphorus: 4.2 mg/dL (ref 3.0–4.3)
Potassium: 4.5 mmol/L (ref 3.5–5.2)
Sodium: 141 mmol/L (ref 134–144)

## 2018-12-13 ENCOUNTER — Other Ambulatory Visit: Payer: Self-pay | Admitting: Family Medicine

## 2018-12-13 DIAGNOSIS — J432 Centrilobular emphysema: Secondary | ICD-10-CM

## 2018-12-25 ENCOUNTER — Ambulatory Visit (INDEPENDENT_AMBULATORY_CARE_PROVIDER_SITE_OTHER): Payer: Medicare Other

## 2018-12-25 VITALS — Ht 62.0 in | Wt 172.5 lb

## 2018-12-25 DIAGNOSIS — Z0000 Encounter for general adult medical examination without abnormal findings: Secondary | ICD-10-CM | POA: Diagnosis not present

## 2018-12-25 NOTE — Patient Instructions (Signed)
Ms. Brittany Parks , Thank you for taking time to come for your Medicare Wellness Visit. I appreciate your ongoing commitment to your health goals. Please review the following plan we discussed and let me know if I can assist you in the future.   Screening recommendations/referrals: Colonoscopy: no longer required Mammogram: no longer required Bone Density: no longer required Recommended yearly ophthalmology/optometry visit for glaucoma screening and checkup Recommended yearly dental visit for hygiene and checkup  Vaccinations: Influenza vaccine: postponed Pneumococcal vaccine: done 1999 Tdap vaccine: postponed Shingles vaccine: postponed    Advanced directives: on file with [facility]  Conditions/risks identified: recommend drinking 6-8 glasses of water per day  Next appointment: Please follow up in one year for your Medicare Annual Wellness visit.     Preventive Care 60 Years and Older, Female Preventive care refers to lifestyle choices and visits with your health care provider that can promote health and wellness. What does preventive care include?  A yearly physical exam. This is also called an annual well check.  Dental exams once or twice a year.  Routine eye exams. Ask your health care provider how often you should have your eyes checked.  Personal lifestyle choices, including:  Daily care of your teeth and gums.  Regular physical activity.  Eating a healthy diet.  Avoiding tobacco and drug use.  Limiting alcohol use.  Practicing safe sex.  Taking low-dose aspirin every day.  Taking vitamin and mineral supplements as recommended by your health care provider. What happens during an annual well check? The services and screenings done by your health care provider during your annual well check will depend on your age, overall health, lifestyle risk factors, and family history of disease. Counseling  Your health care provider may ask you questions about your:  Alcohol use.   Tobacco use.  Drug use.  Emotional well-being.  Home and relationship well-being.  Sexual activity.  Eating habits.  History of falls.  Memory and ability to understand (cognition).  Work and work Statistician.  Reproductive health. Screening  You may have the following tests or measurements:  Height, weight, and BMI.  Blood pressure.  Lipid and cholesterol levels. These may be checked every 5 years, or more frequently if you are over 31 years old.  Skin check.  Lung cancer screening. You may have this screening every year starting at age 101 if you have a 30-pack-year history of smoking and currently smoke or have quit within the past 15 years.  Fecal occult blood test (FOBT) of the stool. You may have this test every year starting at age 27.  Flexible sigmoidoscopy or colonoscopy. You may have a sigmoidoscopy every 5 years or a colonoscopy every 10 years starting at age 43.  Hepatitis C blood test.  Hepatitis B blood test.  Sexually transmitted disease (STD) testing.  Diabetes screening. This is done by checking your blood sugar (glucose) after you have not eaten for a while (fasting). You may have this done every 1-3 years.  Bone density scan. This is done to screen for osteoporosis. You may have this done starting at age 41.  Mammogram. This may be done every 1-2 years. Talk to your health care provider about how often you should have regular mammograms. Talk with your health care provider about your test results, treatment options, and if necessary, the need for more tests. Vaccines  Your health care provider may recommend certain vaccines, such as:  Influenza vaccine. This is recommended every year.  Tetanus, diphtheria, and acellular  pertussis (Tdap, Td) vaccine. You may need a Td booster every 10 years.  Zoster vaccine. You may need this after age 51.  Pneumococcal 13-valent conjugate (PCV13) vaccine. One dose is recommended after age 86.   Pneumococcal polysaccharide (PPSV23) vaccine. One dose is recommended after age 61. Talk to your health care provider about which screenings and vaccines you need and how often you need them. This information is not intended to replace advice given to you by your health care provider. Make sure you discuss any questions you have with your health care provider. Document Released: 06/13/2015 Document Revised: 02/04/2016 Document Reviewed: 03/18/2015 Elsevier Interactive Patient Education  2017 [employer] Prevention in the Home Falls can cause injuries. They can happen to people of all ages. There are many things you can do to make your home safe and to help prevent falls. What can I do on the outside of my home?  Regularly fix the edges of walkways and driveways and fix any cracks.  Remove anything that might make you trip as you walk through a door, such as a raised step or threshold.  Trim any bushes or trees on the path to your home.  Use bright outdoor lighting.  Clear any walking paths of anything that might make someone trip, such as rocks or tools.  Regularly check to see if handrails are loose or broken. Make sure that both sides of any steps have handrails.  Any raised decks and porches should have guardrails on the edges.  Have any leaves, snow, or ice cleared regularly.  Use sand or salt on walking paths during winter.  Clean up any spills in your garage right away. This includes oil or grease spills. What can I do in the bathroom?  Use night lights.  Install grab bars by the toilet and in the tub and shower. Do not use towel bars as grab bars.  Use non-skid mats or decals in the tub or shower.  If you need to sit down in the shower, use a plastic, non-slip stool.  Keep the floor dry. Clean up any water that spills on the floor as soon as it happens.  Remove soap buildup in the tub or shower regularly.  Attach bath mats securely with double-sided non-slip  rug tape.  Do not have throw rugs and other things on the floor that can make you trip. What can I do in the bedroom?  Use night lights.  Make sure that you have a light by your bed that is easy to reach.  Do not use any sheets or blankets that are too big for your bed. They should not hang down onto the floor.  Have a firm chair that has side arms. You can use this for support while you get dressed.  Do not have throw rugs and other things on the floor that can make you trip. What can I do in the kitchen?  Clean up any spills right away.  Avoid walking on wet floors.  Keep items that you use a lot in easy-to-reach places.  If you need to reach something above you, use a strong step stool that has a grab bar.  Keep electrical cords out of the way.  Do not use floor polish or wax that makes floors slippery. If you must use wax, use non-skid floor wax.  Do not have throw rugs and other things on the floor that can make you trip. What can I do with my stairs?  Do not leave any items on the stairs.  Make sure that there are handrails on both sides of the stairs and use them. Fix handrails that are broken or loose. Make sure that handrails are as long as the stairways.  Check any carpeting to make sure that it is firmly attached to the stairs. Fix any carpet that is loose or worn.  Avoid having throw rugs at the top or bottom of the stairs. If you do have throw rugs, attach them to the floor with carpet tape.  Make sure that you have a light switch at the top of the stairs and the bottom of the stairs. If you do not have them, ask someone to add them for you. What else can I do to help prevent falls?  Wear shoes that:  Do not have high heels.  Have rubber bottoms.  Are comfortable and fit you well.  Are closed at the toe. Do not wear sandals.  If you use a stepladder:  Make sure that it is fully opened. Do not climb a closed stepladder.  Make sure that both sides of  the stepladder are locked into place.  Ask someone to hold it for you, if possible.  Clearly mark and make sure that you can see:  Any grab bars or handrails.  First and last steps.  Where the edge of each step is.  Use tools that help you move around (mobility aids) if they are needed. These include:  Canes.  Walkers.  Scooters.  Crutches.  Turn on the lights when you go into a dark area. Replace any light bulbs as soon as they burn out.  Set up your furniture so you have a clear path. Avoid moving your furniture around.  If any of your floors are uneven, fix them.  If there are any pets around you, be aware of where they are.  Review your medicines with your doctor. Some medicines can make you feel dizzy. This can increase your chance of falling. Ask your doctor what other things that you can do to help prevent falls. This information is not intended to replace advice given to you by your health care provider. Make sure you discuss any questions you have with your health care provider. Document Released: 03/13/2009 Document Revised: 10/23/2015 Document Reviewed: 06/21/2014 Elsevier Interactive Patient Education  2017 [employer].

## 2018-12-25 NOTE — Progress Notes (Addendum)
Subjective:   Brittany Parks is a 85 y.o. female who presents for Medicare Annual (Subsequent) preventive examination.  Virtual Visit via Telephone Note  I connected with Brittany Parks on 12/25/18 at 10:40 AM EDT by telephone and verified that I am speaking with the correct person using two identifiers.  Medicare Annual Wellness visit completed telephonically due to Covid-19 pandemic.   Location: Patient: home Provider: office   I discussed the limitations, risks, security and privacy concerns of performing an evaluation and management service by telephone and the availability of in person appointments. The patient expressed understanding and agreed to proceed.  Some vital signs may be absent or patient reported. Pt does not have at home blood pressure monitor.   [name], LPN   Review of Systems:   Cardiac Risk Factors include: advanced age (>73men, >54 women);obesity (BMI >30kg/m2);sedentary lifestyle;hypertension     Objective:     Vitals: Ht 5\' 2"  (1.575 m)   Wt 172 lb 8 oz (78.2 kg)   BMI 31.55 kg/m   Body mass index is 31.55 kg/m.  Advanced Directives 12/25/2018 12/21/2017  Does Patient Have a Medical Advance Directive? Yes Yes  Type of Paramedic of [place];Living will [place];Living will  Copy of [place] in Chart? No - copy requested No - copy requested    Tobacco Social History   Tobacco Use  Smoking Status Former Smoker  . Packs/day: 1.00  . Years: 70.00  . Pack years: 70.00  . Types: Cigarettes  . Quit date: 07/2017  . Years since quitting: 1.4  Smokeless Tobacco Never Used  Tobacco Comment   smoking cessation materials not required     Counseling given: Not Answered Comment: smoking cessation materials not required   Clinical Intake:  Pre-visit preparation completed: Yes  Pain : No/denies pain     BMI - recorded: 31.55 Nutritional Status: BMI > 30  Obese  Nutritional Risks: None Diabetes: No  How often do you need to have someone help you when you read instructions, pamphlets, or other written materials from your doctor or pharmacy?: 1 - Never  Interpreter Needed?: No  Information entered by :: [name] LPN  Past Medical History:  Diagnosis Date  . Abdominal aortic aneurysm (AAA) ([place])   . Adrenal mass, left ([place])   . COPD (chronic obstructive pulmonary disease) ([place])   . COPD (chronic obstructive pulmonary disease) ([place])   . Emphysema lung ([place])   . Hyperlipidemia   . Hypertension   . Hypoxia    Past Surgical History:  Procedure Laterality Date  . BLADDER SURGERY    . BLADDER TUMOR EXCISION     Family History  Problem Relation Age of Onset  . Alzheimer's disease Mother   . Heart attack Father   . Aortic aneurysm Sister   . Cancer Brother   . Hepatomegaly Brother   . Alcohol abuse Brother    Social History   Socioeconomic History  . Marital status: Widowed    Spouse name: Not on file  . Number of children: 5  . Years of education: some college  . Highest education level: 12th grade  Occupational History    Employer: RETIRED  Social Needs  . Financial resource strain: Not hard at all  . Food insecurity    Worry: Never true    Inability: Never true  . Transportation needs    Medical: No    Non-medical: No  Tobacco Use  .  Smoking status: Former Smoker    Packs/day: 1.00    Years: 70.00    Pack years: 70.00    Types: Cigarettes    Quit date: 07/2017    Years since quitting: 1.4  . Smokeless tobacco: Never Used  . Tobacco comment: smoking cessation materials not required  Substance and Sexual Activity  . Alcohol use: No    Alcohol/week: 0.0 standard drinks  . Drug use: No  . Sexual activity: Never  Lifestyle  . Physical activity    Days per week: 0 days    Minutes per session: 0 min  . Stress: Only a little  Relationships  . Social connections    Talks on phone: More than three times a week     Gets together: Twice a week    Attends religious service: Never    Active member of club or organization: No    Attends meetings of clubs or organizations: Never    Relationship status: Widowed  Other Topics Concern  . Not on file  Social History Narrative  . Not on file    Outpatient Encounter Medications as of 12/25/2018  Medication Sig  . albuterol (PROVENTIL HFA;VENTOLIN HFA) 108 (90 Base) MCG/ACT inhaler Inhale 2 puffs into the lungs every 6 (six) hours as needed for wheezing or shortness of breath.  [name] ELLIPTA 62.5-25 MCG/INH AEPB USE 1 INHALATION ORALLY    DAILY  . aspirin EC 81 MG tablet Take 81 mg by mouth daily.  Marland Kitchen telmisartan-hydrochlorothiazide (MICARDIS HCT) 40-12.5 MG tablet Take 1 tablet by mouth daily.   No facility-administered encounter medications on file as of 12/25/2018.     Activities of Daily Living In your present state of health, do you have any difficulty performing the following activities: 12/25/2018  Hearing? Y  Comment hearing aids not in working order  Vision? N  Comment wears glasses  Difficulty concentrating or making decisions? N  Walking or climbing stairs? Y  Dressing or bathing? N  Doing errands, shopping? N  Preparing Food and eating ? N  Using the Toilet? N  In the past six months, have you accidently leaked urine? Y  Do you have problems with loss of bowel control? N  Managing your Medications? N  Managing your Finances? N  Housekeeping or managing your Housekeeping? N  Some recent data might be hidden    Patient Care Team: Juline Patch, MD as PCP - General (Family Medicine) Risa Grill, MD as Consulting Physician (Urology)    Assessment:   This is a routine wellness examination for [name].  Exercise Activities and Dietary recommendations Current Exercise Habits: The patient does not participate in regular exercise at present, Exercise limited by: respiratory conditions(s);orthopedic condition(s)  Goals    .  DIET - INCREASE WATER INTAKE     Recommend to drink at least 6-8 8oz glasses of water per day.       Fall Risk Fall Risk  12/25/2018 05/22/2018 12/21/2017 05/16/2017 12/04/2015  Falls in the past year? 0 0 No No No  Number falls in past yr: 0 - - - -  Injury with Fall? 0 - - - -  Risk for fall due to : Impaired balance/gait Impaired balance/gait Impaired vision;History of fall(s);Impaired balance/gait - -  Risk for fall due to: Comment - - wears eyeglasses; vertigo; ambulates with cane; dizziness - -  Follow up Falls prevention discussed Falls evaluation completed - - -   FALL RISK PREVENTION PERTAINING TO THE HOME:  Any stairs in or around the home? Yes  If so, do they handrails? Yes   Home free of loose throw rugs in walkways, pet beds, electrical cords, etc? Yes  Adequate lighting in your home to reduce risk of falls? Yes   ASSISTIVE DEVICES UTILIZED TO PREVENT FALLS:  Life alert? No  Use of a cane, walker or w/c? Yes  Grab bars in the bathroom? Yes  Shower chair or bench in shower? Yes  Elevated toilet seat or a handicapped toilet? No  DME ORDERS:  DME order needed?  No   TIMED UP AND GO:  Was the test performed? No . Telephonic visit  Education: Fall risk prevention has been discussed.  Intervention(s) required? No   Depression Screen PHQ 2/9 Scores 12/25/2018 11/28/2018 05/22/2018 12/21/2017  PHQ - 2 Score 0 0 0 0  PHQ- 9 Score 0 0 0 0     Cognitive Function     6CIT Screen 12/25/2018 12/21/2017  What Year? 0 points 0 points  What month? 0 points 0 points  What time? 0 points 0 points  Count back from 20 0 points 0 points  Months in reverse 0 points 0 points  Repeat phrase 0 points 0 points  Total Score 0 0     There is no immunization history on file for this patient.  Qualifies for Shingles Vaccine? Yes  . Due for Shingrix. Education has been provided regarding the importance of this vaccine. Pt has been advised to call insurance company to determine out  of pocket expense. Advised may also receive vaccine at local pharmacy or Health Dept. Verbalized acceptance and understanding.  Tdap: Although this vaccine is not a covered service during a Wellness Exam, does the patient still wish to receive this vaccine today?  No .  Education has been provided regarding the importance of this vaccine. Advised may receive this vaccine at local pharmacy or Health Dept. Aware to provide a copy of the vaccination record if obtained from local pharmacy or Health Dept. Verbalized acceptance and understanding.  Flu Vaccine: Due for Flu vaccine. Does the patient want to receive this vaccine today?  No . Education has been provided regarding the importance of this vaccine but still declined. Advised may receive this vaccine at local pharmacy or Health Dept. Aware to provide a copy of the vaccination record if obtained from local pharmacy or Health Dept. Verbalized acceptance and understanding.  Pneumococcal Vaccine: Due for Pneumococcal vaccine. Does the patient want to receive this vaccine today?  No . Education has been provided regarding the importance of this vaccine but still declined. Advised may receive this vaccine at local pharmacy or Health Dept. Aware to provide a copy of the vaccination record if obtained from local pharmacy or Health Dept. Verbalized acceptance and understanding.   Screening Tests Health Maintenance  Topic Date Due  . TETANUS/TDAP  08/01/1949  . DEXA SCAN  07/31/1997  . PNA vac Low Risk Adult (1 of 2 - PCV13) 07/31/1997  . INFLUENZA VACCINE  12/30/2018   Cancer Screenings:  Colorectal Screening:  No longer required.   Mammogram:  No longer required.   Bone Density: No longer required  Lung Cancer Screening: (Low Dose CT Chest recommended if Age 57-80 years, 30 pack-year currently smoking OR have quit w/in 15years.) does not qualify.   Additional Screening:  Hepatitis C Screening: No longer required  Vision Screening:  Recommended annual ophthalmology exams for early detection of glaucoma and other disorders of the eye. Is  the patient up to date with their annual eye exam?  Yes  Who is the provider or what is the name of the office in which the pt attends annual eye exams? Dr. Mallie Mussel  Dental Screening: Recommended annual dental exams for proper oral hygiene  Community Resource Referral:  CRR required this visit?  No      Plan:     I have personally reviewed and addressed the Medicare Annual Wellness questionnaire and have noted the following in the patient's chart:  A. Medical and social history B. Use of alcohol, tobacco or illicit drugs  C. Current medications and supplements D. Functional ability and status E.  Nutritional status F.  Physical activity G. Advance directives H. List of other physicians I.  Hospitalizations, surgeries, and ER visits in previous 12 months J.  [place] such as hearing and vision if needed, cognitive and depression L. Referrals and appointments   In addition, I have reviewed and discussed with patient certain preventive protocols, quality metrics, and best practice recommendations. A written personalized care plan for preventive services as well as general preventive health recommendations were provided to patient.    Signed,  [name], LPN Nurse Health Advisor   Nurse Notes: pt doing well despite primarily being in quarantine due to Covid-19. She has a supportive family and positive outlook.

## 2019-03-05 ENCOUNTER — Other Ambulatory Visit: Payer: Self-pay

## 2019-03-05 DIAGNOSIS — J432 Centrilobular emphysema: Secondary | ICD-10-CM

## 2019-03-05 MED ORDER — ANORO ELLIPTA 62.5-25 MCG/INH IN AEPB: INHALATION_SPRAY | RESPIRATORY_TRACT | 0 refills | Status: DC

## 2019-05-07 ENCOUNTER — Ambulatory Visit: Payer: Medicare Other | Admitting: Family Medicine

## 2019-05-07 ENCOUNTER — Encounter: Payer: Self-pay | Admitting: Family Medicine

## 2019-05-07 ENCOUNTER — Other Ambulatory Visit: Payer: Self-pay

## 2019-05-07 VITALS — BP 138/80 | HR 80 | Ht 62.0 in | Wt 163.0 lb

## 2019-05-07 DIAGNOSIS — R933 Abnormal findings on diagnostic imaging of other parts of digestive tract: Secondary | ICD-10-CM

## 2019-05-07 DIAGNOSIS — E782 Mixed hyperlipidemia: Secondary | ICD-10-CM | POA: Diagnosis not present

## 2019-05-07 DIAGNOSIS — I714 Abdominal aortic aneurysm, without rupture, unspecified: Secondary | ICD-10-CM

## 2019-05-07 DIAGNOSIS — I10 Essential (primary) hypertension: Secondary | ICD-10-CM | POA: Diagnosis not present

## 2019-05-07 DIAGNOSIS — J432 Centrilobular emphysema: Secondary | ICD-10-CM | POA: Diagnosis not present

## 2019-05-07 DIAGNOSIS — R918 Other nonspecific abnormal finding of lung field: Secondary | ICD-10-CM

## 2019-05-07 MED ORDER — ASPIRIN EC 81 MG PO TBEC: 81.0000 mg | DELAYED_RELEASE_TABLET | Freq: Every day | ORAL | 3 refills | Status: AC

## 2019-05-07 MED ORDER — TELMISARTAN-HCTZ 40-12.5 MG PO TABS: 1.0000 | ORAL_TABLET | Freq: Every day | ORAL | 1 refills | Status: AC

## 2019-05-07 MED ORDER — ANORO ELLIPTA 62.5-25 MCG/INH IN AEPB: INHALATION_SPRAY | RESPIRATORY_TRACT | 1 refills | Status: DC

## 2019-05-07 MED ORDER — ALBUTEROL SULFATE HFA 108 (90 BASE) MCG/ACT IN AERS: 2.0000 | INHALATION_SPRAY | Freq: Four times a day (QID) | RESPIRATORY_TRACT | 3 refills | Status: AC | PRN

## 2019-05-07 NOTE — Progress Notes (Signed)
Date:  05/07/2019   Name:  Brittany Parks   DOB:  [birth year]   MRN:  [id number]   Chief Complaint: Hypertension and COPD  Hypertension This is a chronic problem. The current episode started more than 1 year ago. The problem is unchanged. The problem is controlled. Associated symptoms include shortness of breath. Pertinent negatives include no anxiety, blurred vision, chest pain, headaches, malaise/fatigue, neck pain, orthopnea, palpitations, peripheral edema, PND or sweats. There are no associated agents to hypertension. There are no known risk factors for coronary artery disease. Past treatments include angiotensin blockers and diuretics. The current treatment provides moderate improvement. There are no compliance problems.  There is no history of angina, kidney disease, CAD/MI, CVA, heart failure, left ventricular hypertrophy, PVD or retinopathy. AAA. There is no history of chronic renal disease, coarctation of the aorta, hyperaldosteronism, a hypertension causing med, pheochromocytoma or renovascular disease.  COPD She complains of shortness of breath and wheezing. There is no chest tightness, cough, difficulty breathing, frequent throat clearing, hemoptysis, hoarse voice or sputum production. This is a chronic problem. The current episode started more than 1 year ago. The problem occurs intermittently. The problem has been gradually improving (stable). Pertinent negatives include no appetite change, chest pain, dyspnea on exertion, ear congestion, ear pain, fever, headaches, heartburn, malaise/fatigue, myalgias, nasal congestion, orthopnea, PND, postnasal drip, rhinorrhea, sneezing, sore throat, sweats, trouble swallowing or weight loss. Her symptoms are aggravated by change in weather. Her symptoms are alleviated by beta-agonist and ipratropium. She reports moderate improvement on treatment. Her past medical history is significant for COPD.  Hyperlipidemia This is a chronic problem. The  current episode started more than 1 year ago. The problem is controlled. She has no history of chronic renal disease. Associated symptoms include shortness of breath. Pertinent negatives include no chest pain or myalgias.    Lab Results  Component Value Date   CREATININE 0.82 11/28/2018   BUN 12 11/28/2018   NA 141 11/28/2018   K 4.5 11/28/2018   CL 98 11/28/2018   CO2 25 11/28/2018   Lab Results  Component Value Date   CHOL 233 (H) 11/28/2018   HDL 42 11/28/2018   LDLCALC 155 (H) 11/28/2018   TRIG 180 (H) 11/28/2018   CHOLHDL 4.0 12/04/2015   No results found for: TSH Lab Results  Component Value Date   HGBA1C 6.1 12/01/2011     Review of Systems  Constitutional: Negative.  Negative for appetite change, chills, fatigue, fever, malaise/fatigue, unexpected weight change and weight loss.  HENT: Negative for congestion, ear discharge, ear pain, hoarse voice, postnasal drip, rhinorrhea, sinus pressure, sneezing, sore throat and trouble swallowing.   Eyes: Negative for blurred vision, photophobia, pain, discharge, redness and itching.  Respiratory: Positive for shortness of breath and wheezing. Negative for cough, hemoptysis, sputum production, chest tightness and stridor.   Cardiovascular: Negative for chest pain, dyspnea on exertion, palpitations, orthopnea and PND.  Gastrointestinal: Negative for abdominal pain, blood in stool, constipation, diarrhea, heartburn, nausea and vomiting.  Endocrine: Negative for cold intolerance, heat intolerance, polydipsia, polyphagia and polyuria.  Genitourinary: Negative for dysuria, flank pain, frequency, hematuria, menstrual problem, pelvic pain, urgency, vaginal bleeding and vaginal discharge.  Musculoskeletal: Negative for arthralgias, back pain, myalgias and neck pain.  Skin: Negative for rash.  Allergic/Immunologic: Negative for environmental allergies and food allergies.  Neurological: Negative for dizziness, weakness, light-headedness,  numbness and headaches.  Hematological: Negative for adenopathy. Does not bruise/bleed easily.  Psychiatric/Behavioral: Negative for dysphoric mood. The  patient is not nervous/anxious.     Patient Active Problem List   Diagnosis Date Noted  . Centrilobular emphysema ([place]) 11/18/2017  . Hypoxia 08/04/2016  . Pyuria 03/05/2015  . Malignant neoplasm of overlapping sites of bladder ([place]) 02/05/2015  . Abdominal aortic aneurysm greater than 39 mm in diameter ([place]) 01/23/2015  . Adrenal mass, left ([place]) 01/23/2015  . Bladder neoplasm of uncertain malignant potential 01/23/2015  . Kidney filling defect 01/23/2015  . Lymphadenopathy, abdominal 01/23/2015  . Urinary retention 01/15/2015  . Difficulty in urination 01/14/2015  . Gross hematuria 01/14/2015  . History of nephrolithiasis 01/14/2015  . Incomplete emptying of bladder 01/14/2015  . COPD exacerbation ([place]) 12/05/2014  . Essential hypertension 12/05/2014  . Hyperlipidemia 12/05/2014    Allergies  Allergen Reactions  . Other Other (See Comments), Itching and Swelling    Uncoded Allergy. Allergen: ivp dye Uncoded Allergy. Allergen: Shellfish Contraindicated due to shellfish allergy Uncoded Allergy. Allergen: Shellfish Uncoded Allergy. Allergen: ivp dye   . Penicillins Itching, Other (See Comments) and Swelling    Other Reaction: Other reaction   . Shellfish-Derived Products Itching and Swelling  . Iodinated Diagnostic Agents     Contraindicated due to shellfish allergy  . Azithromycin Rash    Past Surgical History:  Procedure Laterality Date  . BLADDER SURGERY    . BLADDER TUMOR EXCISION      Social History   Tobacco Use  . Smoking status: Former Smoker    Packs/day: 1.00    Years: 70.00    Pack years: 70.00    Types: Cigarettes    Quit date: 07/2017    Years since quitting: 1.7  . Smokeless tobacco: Never Used  . Tobacco comment: smoking cessation materials not required  Substance Use Topics  . Alcohol use:  No    Alcohol/week: 0.0 standard drinks  . Drug use: No     Medication list has been reviewed and updated.  Current Meds  Medication Sig  . albuterol (PROVENTIL HFA;VENTOLIN HFA) 108 (90 Base) MCG/ACT inhaler Inhale 2 puffs into the lungs every 6 (six) hours as needed for wheezing or shortness of breath.  Marland Kitchen aspirin EC 81 MG tablet Take 81 mg by mouth daily.  Marland Kitchen telmisartan-hydrochlorothiazide (MICARDIS HCT) 40-12.5 MG tablet Take 1 tablet by mouth daily.  Marland Kitchen umeclidinium-vilanterol (ANORO ELLIPTA) 62.5-25 MCG/INH AEPB USE 1 INHALATION ORAL    DAILY    PHQ 2/9 Scores 05/07/2019 12/25/2018 11/28/2018 05/22/2018  PHQ - 2 Score 1 0 0 0  PHQ- 9 Score 2 0 0 0    BP Readings from Last 3 Encounters:  05/07/19 138/80  11/28/18 130/80  05/22/18 120/70    Physical Exam Vitals signs and nursing note reviewed.  Constitutional:      Appearance: She is well-developed. She is obese.  HENT:     Head: Normocephalic.     Right Ear: Tympanic membrane, ear canal and external ear normal. There is no impacted cerumen.     Left Ear: Tympanic membrane, ear canal and external ear normal. There is no impacted cerumen.     Nose: Nose normal. No congestion or rhinorrhea.  Eyes:     General: Lids are everted, no foreign bodies appreciated. No scleral icterus.       Left eye: No foreign body or hordeolum.     Conjunctiva/sclera: Conjunctivae normal.     Right eye: Right conjunctiva is not injected.     Left eye: Left conjunctiva is not injected.     Pupils:  Pupils are equal, round, and reactive to light.  Neck:     Musculoskeletal: Normal range of motion and neck supple.     Thyroid: No thyromegaly.     Vascular: No JVD.     Trachea: No tracheal deviation.  Cardiovascular:     Rate and Rhythm: Normal rate and regular rhythm.     Heart sounds: Normal heart sounds. No murmur. No friction rub. No gallop.   Pulmonary:     Effort: Pulmonary effort is normal. No respiratory distress.     Breath sounds:  Normal breath sounds. No stridor. No wheezing, rhonchi or rales.  Abdominal:     General: Bowel sounds are normal.     Palpations: Abdomen is soft. There is no mass.     Tenderness: There is no abdominal tenderness. There is no guarding or rebound.  Musculoskeletal: Normal range of motion.        General: No tenderness.  Lymphadenopathy:     Cervical: No cervical adenopathy.  Skin:    General: Skin is warm.     Findings: No rash.  Neurological:     Mental Status: She is alert and oriented to person, place, and time.     Cranial Nerves: No cranial nerve deficit.     Deep Tendon Reflexes: Reflexes normal.  Psychiatric:        Mood and Affect: Mood is not anxious or depressed.     Wt Readings from Last 3 Encounters:  05/07/19 163 lb (73.9 kg)  12/25/18 172 lb 8 oz (78.2 kg)  11/28/18 172 lb (78 kg)    BP 138/80   Pulse 80   Ht 5\' 2"  (1.575 m)   Wt 163 lb (73.9 kg)   BMI 29.81 kg/m   Assessment and Plan: 1. Centrilobular emphysema (HCC) Chronic.  Controlled.  Stable.  Patient with a history of emphysema secondary to smoking and advanced age.  Patient has reasonable control on Anoro 1 puff a day.  Patient during seasonal exacerbation also uses albuterol inhaler 1 to 2 puffs every 6 hours as well. - umeclidinium-vilanterol (ANORO ELLIPTA) 62.5-25 MCG/INH AEPB; USE 1 INHALATION ORAL    DAILY  Dispense: 3 each; Refill: 1 - albuterol (VENTOLIN HFA) 108 (90 Base) MCG/ACT inhaler; Inhale 2 puffs into the lungs every 6 (six) hours as needed for wheezing or shortness of breath.  Dispense: 18 g; Refill: 3  2. Essential hypertension Chronic.  Controlled.  Stable.  Continue Micardis HCT 40-12.5 mg once a day.  Will check renal function panel. - Renal Function Panel - telmisartan-hydrochlorothiazide (MICARDIS HCT) 40-12.5 MG tablet; Take 1 tablet by mouth daily.  Dispense: 90 tablet; Refill: 1  3. Moderate mixed hyperlipidemia not requiring statin therapy Chronic.  Controlled.  Stable.   Triglycerides have been noted to be elevated in the mild range in the past.  I suspect the elevated LDL was secondary to a nonfasting evaluation in June of this year.  We will repeat lipid panel.  Patient was given low-cholesterol diet. - Lipid Panel With LDL/HDL Ratio  4. Abdominal aortic aneurysm greater than 39 mm in diameter [facility]) Patient has a history of an abdominal aortic aneurysm which was in the 4 0.9 to 5.04 centimeters range.  Patient was seen by vascular at [facility] and patient told physician that she did not want further evaluation because it is unlikely that she will have surgery.  This was discussed with patient and her son and she does not prefer to have any further  treatment.  5. Abnormal CT scan, small bowel Patient was noted to have an area in the duodenum which could be consistent with a neuroendocrine tumor.  It was also noted that this was discussed with the patient in the past for which she did not want any further evaluation.  This is again reviewed with the patient and patient does not want any further evaluation at this time.  6. Pulmonary nodules Patient was noted to also have multiple pulmonary nodules in and she did not want any further evaluation at this time.

## 2019-05-08 LAB — LIPID PANEL WITH LDL/HDL RATIO
Cholesterol, Total: 258 mg/dL — ABNORMAL HIGH (ref 100–199)
HDL: 43 mg/dL (ref >39)
LDL Chol Calc (NIH): 174 mg/dL — ABNORMAL HIGH (ref 0–99)
LDL/HDL Ratio: 4 ratio — ABNORMAL HIGH (ref 0.0–3.2)
Triglycerides: 218 mg/dL — ABNORMAL HIGH (ref 0–149)
VLDL Cholesterol Cal: 41 mg/dL — ABNORMAL HIGH (ref 5–40)

## 2019-05-08 LAB — RENAL FUNCTION PANEL
Albumin: 4.3 g/dL (ref 3.6–4.6)
BUN/Creatinine Ratio: 25 (ref 12–28)
BUN: 26 mg/dL (ref 8–27)
CO2: 28 mmol/L (ref 20–29)
Calcium: 10.2 mg/dL (ref 8.7–10.3)
Chloride: 96 mmol/L (ref 96–106)
Creatinine, Ser: 1.02 mg/dL — ABNORMAL HIGH (ref 0.57–1.00)
GFR calc Af Amer: 58 mL/min/{1.73_m2} — ABNORMAL LOW (ref >59)
GFR calc non Af Amer: 50 mL/min/{1.73_m2} — ABNORMAL LOW (ref >59)
Glucose: 103 mg/dL — ABNORMAL HIGH (ref 65–99)
Phosphorus: 4 mg/dL (ref 3.0–4.3)
Potassium: 4.9 mmol/L (ref 3.5–5.2)
Sodium: 139 mmol/L (ref 134–144)

## 2019-05-21 ENCOUNTER — Other Ambulatory Visit: Payer: Self-pay | Admitting: Family Medicine

## 2019-05-21 DIAGNOSIS — J432 Centrilobular emphysema: Secondary | ICD-10-CM

## 2019-07-05 ENCOUNTER — Encounter: Admission: EM | Disposition: E | Payer: Self-pay | Source: Home / Self Care | Attending: Vascular Surgery

## 2019-07-05 ENCOUNTER — Other Ambulatory Visit: Payer: Self-pay

## 2019-07-05 ENCOUNTER — Emergency Department: Payer: Medicare Other

## 2019-07-05 ENCOUNTER — Encounter: Payer: Self-pay | Admitting: Emergency Medicine

## 2019-07-05 ENCOUNTER — Ambulatory Visit (INDEPENDENT_AMBULATORY_CARE_PROVIDER_SITE_OTHER)
Admission: EM | Admit: 2019-07-05 | Discharge: 2019-07-05 | Disposition: A | Discharge status: Other Acute Inpatient Hospital | Payer: Medicare Other | Source: Home / Self Care | Attending: Family Medicine | Admitting: Family Medicine

## 2019-07-05 ENCOUNTER — Inpatient Hospital Stay
Admission: EM | Admit: 2019-07-05 | Discharge: 2019-07-30 | DRG: 268 | Disposition: E | Payer: Medicare Other | Attending: Vascular Surgery | Admitting: Vascular Surgery

## 2019-07-05 ENCOUNTER — Emergency Department: Payer: Medicare Other | Admitting: Certified Registered Nurse Anesthetist

## 2019-07-05 DIAGNOSIS — I10 Essential (primary) hypertension: Secondary | ICD-10-CM | POA: Diagnosis present

## 2019-07-05 DIAGNOSIS — D62 Acute posthemorrhagic anemia: Secondary | ICD-10-CM | POA: Diagnosis present

## 2019-07-05 DIAGNOSIS — J441 Chronic obstructive pulmonary disease with (acute) exacerbation: Secondary | ICD-10-CM | POA: Diagnosis not present

## 2019-07-05 DIAGNOSIS — Z01818 Encounter for other preprocedural examination: Secondary | ICD-10-CM

## 2019-07-05 DIAGNOSIS — E279 Disorder of adrenal gland, unspecified: Secondary | ICD-10-CM | POA: Diagnosis present

## 2019-07-05 DIAGNOSIS — R1084 Generalized abdominal pain: Secondary | ICD-10-CM

## 2019-07-05 DIAGNOSIS — T82310A Breakdown (mechanical) of aortic (bifurcation) graft (replacement), initial encounter: Secondary | ICD-10-CM | POA: Diagnosis not present

## 2019-07-05 DIAGNOSIS — J9622 Acute and chronic respiratory failure with hypercapnia: Secondary | ICD-10-CM | POA: Diagnosis not present

## 2019-07-05 DIAGNOSIS — J9600 Acute respiratory failure, unspecified whether with hypoxia or hypercapnia: Secondary | ICD-10-CM

## 2019-07-05 DIAGNOSIS — I739 Peripheral vascular disease, unspecified: Secondary | ICD-10-CM | POA: Diagnosis present

## 2019-07-05 DIAGNOSIS — Z91041 Radiographic dye allergy status: Secondary | ICD-10-CM

## 2019-07-05 DIAGNOSIS — J9621 Acute and chronic respiratory failure with hypoxia: Secondary | ICD-10-CM | POA: Diagnosis not present

## 2019-07-05 DIAGNOSIS — N179 Acute kidney failure, unspecified: Secondary | ICD-10-CM | POA: Diagnosis not present

## 2019-07-05 DIAGNOSIS — Z515 Encounter for palliative care: Secondary | ICD-10-CM | POA: Diagnosis not present

## 2019-07-05 DIAGNOSIS — Z7982 Long term (current) use of aspirin: Secondary | ICD-10-CM

## 2019-07-05 DIAGNOSIS — J449 Chronic obstructive pulmonary disease, unspecified: Secondary | ICD-10-CM | POA: Diagnosis present

## 2019-07-05 DIAGNOSIS — I714 Abdominal aortic aneurysm, without rupture, unspecified: Secondary | ICD-10-CM | POA: Diagnosis present

## 2019-07-05 DIAGNOSIS — I713 Abdominal aortic aneurysm, ruptured, unspecified: Secondary | ICD-10-CM

## 2019-07-05 DIAGNOSIS — I5031 Acute diastolic (congestive) heart failure: Secondary | ICD-10-CM | POA: Diagnosis not present

## 2019-07-05 DIAGNOSIS — Z880 Allergy status to penicillin: Secondary | ICD-10-CM

## 2019-07-05 DIAGNOSIS — Z66 Do not resuscitate: Secondary | ICD-10-CM | POA: Diagnosis present

## 2019-07-05 DIAGNOSIS — Z881 Allergy status to other antibiotic agents status: Secondary | ICD-10-CM

## 2019-07-05 DIAGNOSIS — K8020 Calculus of gallbladder without cholecystitis without obstruction: Secondary | ICD-10-CM | POA: Diagnosis present

## 2019-07-05 DIAGNOSIS — R61 Generalized hyperhidrosis: Secondary | ICD-10-CM | POA: Diagnosis not present

## 2019-07-05 DIAGNOSIS — I161 Hypertensive emergency: Secondary | ICD-10-CM | POA: Diagnosis present

## 2019-07-05 DIAGNOSIS — Z79899 Other long term (current) drug therapy: Secondary | ICD-10-CM | POA: Diagnosis not present

## 2019-07-05 DIAGNOSIS — E785 Hyperlipidemia, unspecified: Secondary | ICD-10-CM | POA: Diagnosis present

## 2019-07-05 DIAGNOSIS — R0682 Tachypnea, not elsewhere classified: Secondary | ICD-10-CM | POA: Diagnosis not present

## 2019-07-05 DIAGNOSIS — R571 Hypovolemic shock: Secondary | ICD-10-CM | POA: Diagnosis not present

## 2019-07-05 DIAGNOSIS — Z8249 Family history of ischemic heart disease and other diseases of the circulatory system: Secondary | ICD-10-CM | POA: Diagnosis not present

## 2019-07-05 DIAGNOSIS — C678 Malignant neoplasm of overlapping sites of bladder: Secondary | ICD-10-CM | POA: Diagnosis present

## 2019-07-05 DIAGNOSIS — Y838 Other surgical procedures as the cause of abnormal reaction of the patient, or of later complication, without mention of misadventure at the time of the procedure: Secondary | ICD-10-CM | POA: Diagnosis not present

## 2019-07-05 DIAGNOSIS — Z87891 Personal history of nicotine dependence: Secondary | ICD-10-CM | POA: Diagnosis not present

## 2019-07-05 DIAGNOSIS — Z888 Allergy status to other drugs, medicaments and biological substances status: Secondary | ICD-10-CM

## 2019-07-05 DIAGNOSIS — Z91013 Allergy to seafood: Secondary | ICD-10-CM

## 2019-07-05 DIAGNOSIS — T4275XA Adverse effect of unspecified antiepileptic and sedative-hypnotic drugs, initial encounter: Secondary | ICD-10-CM | POA: Diagnosis not present

## 2019-07-05 DIAGNOSIS — Y712 Prosthetic and other implants, materials and accessory cardiovascular devices associated with adverse incidents: Secondary | ICD-10-CM | POA: Diagnosis not present

## 2019-07-05 DIAGNOSIS — Z20822 Contact with and (suspected) exposure to covid-19: Secondary | ICD-10-CM | POA: Diagnosis present

## 2019-07-05 DIAGNOSIS — Z9981 Dependence on supplemental oxygen: Secondary | ICD-10-CM

## 2019-07-05 HISTORY — PX: ABDOMINAL AORTIC ENDOVASCULAR STENT GRAFT: SHX5707

## 2019-07-05 HISTORY — PX: ENDOVASCULAR REPAIR/STENT GRAFT: CATH118280

## 2019-07-05 LAB — RESPIRATORY PANEL BY RT PCR (FLU A&B, COVID)
Influenza A by PCR: NEGATIVE
Influenza B by PCR: NEGATIVE
SARS Coronavirus 2 by RT PCR: NEGATIVE

## 2019-07-05 LAB — CBC WITH DIFFERENTIAL/PLATELET
Abs Immature Granulocytes: 0.09 10*3/uL — ABNORMAL HIGH (ref 0.00–0.07)
Abs Immature Granulocytes: 0.13 10*3/uL — ABNORMAL HIGH (ref 0.00–0.07)
Basophils Absolute: 0 10*3/uL (ref 0.0–0.1)
Basophils Absolute: 0 10*3/uL (ref 0.0–0.1)
Basophils Relative: 0 %
Basophils Relative: 0 %
Eosinophils Absolute: 0.1 10*3/uL (ref 0.0–0.5)
Eosinophils Absolute: 0 10*3/uL (ref 0.0–0.5)
Eosinophils Relative: 1 %
Eosinophils Relative: 0 %
HCT: 42.8 % (ref 36.0–46.0)
HCT: 40.5 % (ref 36.0–46.0)
Hemoglobin: 13.7 g/dL (ref 12.0–15.0)
Hemoglobin: 12.9 g/dL (ref 12.0–15.0)
Immature Granulocytes: 1 %
Immature Granulocytes: 1 %
Lymphocytes Relative: 9 %
Lymphocytes Relative: 4 %
Lymphs Abs: 1.2 10*3/uL (ref 0.7–4.0)
Lymphs Abs: 0.7 10*3/uL (ref 0.7–4.0)
MCH: 29.5 pg (ref 26.0–34.0)
MCH: 29.3 pg (ref 26.0–34.0)
MCHC: 32 g/dL (ref 30.0–36.0)
MCHC: 31.9 g/dL (ref 30.0–36.0)
MCV: 92 fL (ref 80.0–100.0)
MCV: 92 fL (ref 80.0–100.0)
Monocytes Absolute: 0.4 10*3/uL (ref 0.1–1.0)
Monocytes Absolute: 0.3 10*3/uL (ref 0.1–1.0)
Monocytes Relative: 3 %
Monocytes Relative: 2 %
Neutro Abs: 12.3 10*3/uL — ABNORMAL HIGH (ref 1.7–7.7)
Neutro Abs: 15.5 10*3/uL — ABNORMAL HIGH (ref 1.7–7.7)
Neutrophils Relative %: 86 %
Neutrophils Relative %: 93 %
Platelets: 360 10*3/uL (ref 150–400)
Platelets: 284 10*3/uL (ref 150–400)
RBC: 4.65 MIL/uL (ref 3.87–5.11)
RBC: 4.4 MIL/uL (ref 3.87–5.11)
RDW: 13.2 % (ref 11.5–15.5)
RDW: 13.3 % (ref 11.5–15.5)
WBC: 14 10*3/uL — ABNORMAL HIGH (ref 4.0–10.5)
WBC: 16.6 10*3/uL — ABNORMAL HIGH (ref 4.0–10.5)
nRBC: 0 % (ref 0.0–0.2)
nRBC: 0 % (ref 0.0–0.2)

## 2019-07-05 LAB — BASIC METABOLIC PANEL
Anion gap: 11 (ref 5–15)
BUN: 15 mg/dL (ref 8–23)
CO2: 25 mmol/L (ref 22–32)
Calcium: 8.6 mg/dL — ABNORMAL LOW (ref 8.9–10.3)
Chloride: 99 mmol/L (ref 98–111)
Creatinine, Ser: 0.73 mg/dL (ref 0.44–1.00)
GFR calc Af Amer: >60 mL/min (ref >60)
GFR calc non Af Amer: >60 mL/min (ref >60)
Glucose, Bld: 208 mg/dL — ABNORMAL HIGH (ref 70–99)
Potassium: 4.2 mmol/L (ref 3.5–5.1)
Sodium: 135 mmol/L (ref 135–145)

## 2019-07-05 LAB — BLOOD GAS, ARTERIAL
Acid-base deficit: 0.7 mmol/L (ref 0.0–2.0)
Acid-base deficit: 0 mmol/L (ref 0.0–2.0)
Bicarbonate: 26.6 mmol/L (ref 20.0–28.0)
Bicarbonate: 27.2 mmol/L (ref 20.0–28.0)
FIO2: 0.5
FIO2: 0.3
MECHVT: 450 mL
MECHVT: 500 mL
O2 Saturation: 99.2 %
O2 Saturation: 95.7 %
PEEP: 5 cmH2O
PEEP: 5 cmH2O
Patient temperature: 37
Patient temperature: 37
RATE: 10 resp/min
RATE: 14 resp/min
pCO2 arterial: 54 mmHg — ABNORMAL HIGH (ref 32.0–48.0)
pCO2 arterial: 54 mmHg — ABNORMAL HIGH (ref 32.0–48.0)
pH, Arterial: 7.3 — ABNORMAL LOW (ref 7.350–7.450)
pH, Arterial: 7.31 — ABNORMAL LOW (ref 7.350–7.450)
pO2, Arterial: 155 mmHg — ABNORMAL HIGH (ref 83.0–108.0)
pO2, Arterial: 87 mmHg (ref 83.0–108.0)

## 2019-07-05 LAB — COMPREHENSIVE METABOLIC PANEL
ALT: 10 U/L (ref 0–44)
AST: 13 U/L — ABNORMAL LOW (ref 15–41)
Albumin: 3.1 g/dL — ABNORMAL LOW (ref 3.5–5.0)
Alkaline Phosphatase: 70 U/L (ref 38–126)
Anion gap: 10 (ref 5–15)
BUN: 17 mg/dL (ref 8–23)
CO2: 26 mmol/L (ref 22–32)
Calcium: 8.9 mg/dL (ref 8.9–10.3)
Chloride: 98 mmol/L (ref 98–111)
Creatinine, Ser: 0.67 mg/dL (ref 0.44–1.00)
GFR calc Af Amer: >60 mL/min (ref >60)
GFR calc non Af Amer: >60 mL/min (ref >60)
Glucose, Bld: 214 mg/dL — ABNORMAL HIGH (ref 70–99)
Potassium: 4 mmol/L (ref 3.5–5.1)
Sodium: 134 mmol/L — ABNORMAL LOW (ref 135–145)
Total Bilirubin: 0.8 mg/dL (ref 0.3–1.2)
Total Protein: 6.6 g/dL (ref 6.5–8.1)

## 2019-07-05 LAB — GLUCOSE, CAPILLARY: Glucose-Capillary: 185 mg/dL — ABNORMAL HIGH (ref 70–99)

## 2019-07-05 LAB — PHOSPHORUS: Phosphorus: 4 mg/dL (ref 2.5–4.6)

## 2019-07-05 LAB — TYPE AND SCREEN
ABO/RH(D): A NEG
Antibody Screen: NEGATIVE

## 2019-07-05 LAB — MRSA PCR SCREENING: MRSA by PCR: NEGATIVE

## 2019-07-05 LAB — TROPONIN I (HIGH SENSITIVITY): Troponin I (High Sensitivity): 6 ng/L (ref <18)

## 2019-07-05 LAB — PROCALCITONIN: Procalcitonin: <0.1 ng/mL

## 2019-07-05 LAB — MAGNESIUM: Magnesium: 1.6 mg/dL — ABNORMAL LOW (ref 1.7–2.4)

## 2019-07-05 SURGERY — INSERTION, ENDOVASCULAR STENT GRAFT, AORTA, ABDOMINAL
Anesthesia: General

## 2019-07-05 SURGERY — ENDOVASCULAR REPAIR/STENT GRAFT
Anesthesia: General

## 2019-07-05 MED ORDER — DOPAMINE-DEXTROSE 3.2-5 MG/ML-% IV SOLN
INTRAVENOUS | Status: AC
  Administered 2019-07-05: 3 ug/kg/min via INTRAVENOUS
  Filled 2019-07-05: qty 250

## 2019-07-05 MED ORDER — ROCURONIUM BROMIDE 100 MG/10ML IV SOLN
INTRAVENOUS | Status: DC | PRN
  Administered 2019-07-05 (×2): 30 mg via INTRAVENOUS
  Administered 2019-07-05: 20 mg via INTRAVENOUS

## 2019-07-05 MED ORDER — LACTATED RINGERS IV BOLUS
1000.0000 mL | Freq: Once | INTRAVENOUS | Status: AC
  Administered 2019-07-05: 1000 mL via INTRAVENOUS

## 2019-07-05 MED ORDER — CHLORHEXIDINE GLUCONATE 0.12% ORAL RINSE (MEDLINE KIT)
15.0000 mL | Freq: Two times a day (BID) | OROMUCOSAL | Status: DC
  Administered 2019-07-05 – 2019-07-09 (×8): 15 mL via OROMUCOSAL

## 2019-07-05 MED ORDER — IOHEXOL 350 MG/ML SOLN
75.0000 mL | Freq: Once | INTRAVENOUS | Status: AC | PRN
  Administered 2019-07-05: 75 mL via INTRAVENOUS

## 2019-07-05 MED ORDER — LABETALOL HCL 5 MG/ML IV SOLN
5.0000 mg | Freq: Once | INTRAVENOUS | Status: AC
  Administered 2019-07-05: 2.5 mg via INTRAVENOUS

## 2019-07-05 MED ORDER — MIDAZOLAM HCL 2 MG/2ML IJ SOLN
1.0000 mg | INTRAMUSCULAR | Status: DC | PRN
  Administered 2019-07-06 – 2019-07-07 (×6): 1 mg via INTRAVENOUS
  Filled 2019-07-05 (×5): qty 2

## 2019-07-05 MED ORDER — LIDOCAINE HCL (CARDIAC) PF 100 MG/5ML IV SOSY
PREFILLED_SYRINGE | INTRAVENOUS | Status: DC | PRN
  Administered 2019-07-05: 60 mg via INTRAVENOUS

## 2019-07-05 MED ORDER — EPHEDRINE SULFATE 50 MG/ML IJ SOLN
INTRAMUSCULAR | Status: AC
  Filled 2019-07-05: qty 1

## 2019-07-05 MED ORDER — ONDANSETRON HCL 4 MG/2ML IJ SOLN
4.0000 mg | Freq: Once | INTRAMUSCULAR | Status: DC
  Filled 2019-07-05: qty 2

## 2019-07-05 MED ORDER — MAGNESIUM SULFATE 2 GM/50ML IV SOLN: 2.0000 g | Freq: Every day | INTRAVENOUS | Status: DC | PRN

## 2019-07-05 MED ORDER — VANCOMYCIN HCL IN DEXTROSE 1-5 GM/200ML-% IV SOLN
1000.0000 mg | Freq: Two times a day (BID) | INTRAVENOUS | Status: AC
  Administered 2019-07-06 (×2): 1000 mg via INTRAVENOUS
  Filled 2019-07-05 (×2): qty 200

## 2019-07-05 MED ORDER — NOREPINEPHRINE 4 MG/250ML-% IV SOLN
0.0000 ug/min | INTRAVENOUS | Status: DC
  Administered 2019-07-05: 4 ug/min via INTRAVENOUS
  Administered 2019-07-06: 5 ug/min via INTRAVENOUS
  Administered 2019-07-07: 4 ug/min via INTRAVENOUS
  Filled 2019-07-05 (×3): qty 250

## 2019-07-05 MED ORDER — ONDANSETRON HCL 4 MG/2ML IJ SOLN
INTRAMUSCULAR | Status: AC
  Filled 2019-07-05: qty 2

## 2019-07-05 MED ORDER — PROPOFOL 10 MG/ML IV BOLUS
INTRAVENOUS | Status: AC
  Filled 2019-07-05: qty 20

## 2019-07-05 MED ORDER — FENTANYL CITRATE (PF) 100 MCG/2ML IJ SOLN
INTRAMUSCULAR | Status: DC | PRN
  Administered 2019-07-05: 50 ug via INTRAVENOUS
  Administered 2019-07-05 (×2): 25 ug via INTRAVENOUS
  Administered 2019-07-05: 50 ug via INTRAVENOUS

## 2019-07-05 MED ORDER — MAGNESIUM SULFATE 2 GM/50ML IV SOLN
2.0000 g | Freq: Once | INTRAVENOUS | Status: AC
  Administered 2019-07-06: 2 g via INTRAVENOUS
  Filled 2019-07-05: qty 50

## 2019-07-05 MED ORDER — EPHEDRINE SULFATE 50 MG/ML IJ SOLN
INTRAMUSCULAR | Status: DC | PRN
  Administered 2019-07-05: 10 mg via INTRAVENOUS
  Administered 2019-07-05: 15 mg via INTRAVENOUS
  Administered 2019-07-05 (×2): 5 mg via INTRAVENOUS
  Administered 2019-07-05: 10 mg via INTRAVENOUS
  Administered 2019-07-05 (×3): 5 mg via INTRAVENOUS
  Administered 2019-07-05: 10 mg via INTRAVENOUS

## 2019-07-05 MED ORDER — DOCUSATE SODIUM 100 MG PO CAPS
100.0000 mg | ORAL_CAPSULE | Freq: Every day | ORAL | Status: DC
  Filled 2019-07-05: qty 1

## 2019-07-05 MED ORDER — FENTANYL CITRATE (PF) 100 MCG/2ML IJ SOLN
INTRAMUSCULAR | Status: AC
  Filled 2019-07-05: qty 2

## 2019-07-05 MED ORDER — LACTATED RINGERS IV SOLN: INTRAVENOUS | Status: DC | PRN

## 2019-07-05 MED ORDER — SODIUM CHLORIDE 0.9 % IV SOLN: INTRAVENOUS | Status: AC

## 2019-07-05 MED ORDER — MORPHINE SULFATE (PF) 2 MG/ML IV SOLN
2.0000 mg | INTRAVENOUS | Status: DC | PRN
  Administered 2019-07-06: 2 mg via INTRAVENOUS
  Filled 2019-07-05: qty 1

## 2019-07-05 MED ORDER — MIDAZOLAM HCL 2 MG/2ML IJ SOLN
1.0000 mg | INTRAMUSCULAR | Status: AC | PRN
  Administered 2019-07-05 – 2019-07-06 (×3): 1 mg via INTRAVENOUS
  Filled 2019-07-05 (×2): qty 2

## 2019-07-05 MED ORDER — ORAL CARE MOUTH RINSE
15.0000 mL | OROMUCOSAL | Status: DC
  Administered 2019-07-06 – 2019-07-09 (×35): 15 mL via OROMUCOSAL

## 2019-07-05 MED ORDER — GUAIFENESIN-DM 100-10 MG/5ML PO SYRP: 15.0000 mL | ORAL_SOLUTION | ORAL | Status: DC | PRN

## 2019-07-05 MED ORDER — POTASSIUM CHLORIDE CRYS ER 20 MEQ PO TBCR: 20.0000 meq | EXTENDED_RELEASE_TABLET | Freq: Every day | ORAL | Status: DC | PRN

## 2019-07-05 MED ORDER — NITROGLYCERIN IN D5W 200-5 MCG/ML-% IV SOLN
INTRAVENOUS | Status: AC
  Filled 2019-07-05: qty 250

## 2019-07-05 MED ORDER — FAMOTIDINE IN NACL 20-0.9 MG/50ML-% IV SOLN
20.0000 mg | Freq: Two times a day (BID) | INTRAVENOUS | Status: DC
  Administered 2019-07-06 – 2019-07-08 (×7): 20 mg via INTRAVENOUS
  Filled 2019-07-05 (×7): qty 50

## 2019-07-05 MED ORDER — ALBUMIN HUMAN 5 % IV SOLN
INTRAVENOUS | Status: AC
  Filled 2019-07-05: qty 500

## 2019-07-05 MED ORDER — ACETAMINOPHEN 650 MG RE SUPP: 325.0000 mg | RECTAL | Status: DC | PRN

## 2019-07-05 MED ORDER — SODIUM CHLORIDE 0.9 % IV SOLN
500.0000 mL | Freq: Once | INTRAVENOUS | Status: AC | PRN
  Administered 2019-07-06: 500 mL via INTRAVENOUS

## 2019-07-05 MED ORDER — ACETAMINOPHEN 325 MG PO TABS: 325.0000 mg | ORAL_TABLET | ORAL | Status: DC | PRN

## 2019-07-05 MED ORDER — ONDANSETRON HCL 4 MG/2ML IJ SOLN
INTRAMUSCULAR | Status: DC | PRN
  Administered 2019-07-05: 4 mg via INTRAVENOUS

## 2019-07-05 MED ORDER — NITROGLYCERIN IN D5W 200-5 MCG/ML-% IV SOLN
5.0000 ug/min | INTRAVENOUS | Status: DC
  Administered 2019-07-09: 5 ug/min via INTRAVENOUS
  Filled 2019-07-05: qty 250

## 2019-07-05 MED ORDER — DIPHENHYDRAMINE HCL 25 MG PO CAPS: 50.0000 mg | ORAL_CAPSULE | Freq: Once | ORAL | Status: DC

## 2019-07-05 MED ORDER — PHENOL 1.4 % MT LIQD
1.0000 | OROMUCOSAL | Status: DC | PRN
  Filled 2019-07-05: qty 177

## 2019-07-05 MED ORDER — HYDROMORPHONE HCL 1 MG/ML IJ SOLN
0.5000 mg | INTRAMUSCULAR | Status: DC | PRN
  Administered 2019-07-05: 0.5 mg via INTRAVENOUS
  Filled 2019-07-05: qty 1

## 2019-07-05 MED ORDER — LABETALOL HCL 5 MG/ML IV SOLN: 10.0000 mg | INTRAVENOUS | Status: DC | PRN

## 2019-07-05 MED ORDER — EPINEPHRINE PF 1 MG/ML IJ SOLN
INTRAMUSCULAR | Status: AC
  Filled 2019-07-05: qty 1

## 2019-07-05 MED ORDER — FENTANYL CITRATE (PF) 100 MCG/2ML IJ SOLN: 50.0000 ug | INTRAMUSCULAR | Status: DC | PRN

## 2019-07-05 MED ORDER — ATROPINE SULFATE 0.4 MG/ML IJ SOLN
INTRAMUSCULAR | Status: DC | PRN
  Administered 2019-07-05: .4 mg via INTRAVENOUS

## 2019-07-05 MED ORDER — SUCCINYLCHOLINE CHLORIDE 20 MG/ML IJ SOLN
INTRAMUSCULAR | Status: DC | PRN
  Administered 2019-07-05: 100 mg via INTRAVENOUS

## 2019-07-05 MED ORDER — METOPROLOL TARTRATE 5 MG/5ML IV SOLN: 2.0000 mg | INTRAVENOUS | Status: DC | PRN

## 2019-07-05 MED ORDER — BUPIVACAINE HCL (PF) 0.25 % IJ SOLN
INTRAMUSCULAR | Status: AC
  Filled 2019-07-05: qty 30

## 2019-07-05 MED ORDER — FENTANYL 2500MCG IN NS 250ML (10MCG/ML) PREMIX INFUSION
0.0000 ug/h | INTRAVENOUS | Status: DC
  Administered 2019-07-05: 25 ug/h via INTRAVENOUS
  Administered 2019-07-06: 350 ug/h via INTRAVENOUS
  Administered 2019-07-06: 400 ug/h via INTRAVENOUS
  Administered 2019-07-07: 200 ug/h via INTRAVENOUS
  Administered 2019-07-07: 250 ug/h via INTRAVENOUS
  Administered 2019-07-07: 400 ug/h via INTRAVENOUS
  Administered 2019-07-08: 350 ug/h via INTRAVENOUS
  Filled 2019-07-05 (×7): qty 250

## 2019-07-05 MED ORDER — FENTANYL CITRATE (PF) 100 MCG/2ML IJ SOLN: 25.0000 ug | Freq: Once | INTRAMUSCULAR | Status: DC

## 2019-07-05 MED ORDER — HYDRALAZINE HCL 20 MG/ML IJ SOLN
5.0000 mg | INTRAMUSCULAR | Status: DC | PRN
  Administered 2019-07-08: 5 mg via INTRAVENOUS
  Filled 2019-07-05: qty 1

## 2019-07-05 MED ORDER — DOPAMINE-DEXTROSE 3.2-5 MG/ML-% IV SOLN: 3.0000 ug/kg/min | INTRAVENOUS | Status: DC

## 2019-07-05 MED ORDER — GLYCOPYRROLATE 0.2 MG/ML IJ SOLN
INTRAMUSCULAR | Status: DC | PRN
  Administered 2019-07-05: .2 mg via INTRAVENOUS

## 2019-07-05 MED ORDER — ETOMIDATE 2 MG/ML IV SOLN
INTRAVENOUS | Status: AC
  Filled 2019-07-05: qty 10

## 2019-07-05 MED ORDER — OXYCODONE-ACETAMINOPHEN 5-325 MG PO TABS: 1.0000 | ORAL_TABLET | ORAL | Status: DC | PRN

## 2019-07-05 MED ORDER — ONDANSETRON HCL 4 MG/2ML IJ SOLN: 4.0000 mg | Freq: Four times a day (QID) | INTRAMUSCULAR | Status: DC | PRN

## 2019-07-05 MED ORDER — HYDROCORTISONE NA SUCCINATE PF 100 MG IJ SOLR
INTRAMUSCULAR | Status: DC | PRN
  Administered 2019-07-05: 100 mg via INTRAVENOUS

## 2019-07-05 MED ORDER — SODIUM CHLORIDE 0.9 % IV BOLUS
500.0000 mL | Freq: Once | INTRAVENOUS | Status: AC
  Administered 2019-07-05: 500 mL via INTRAVENOUS

## 2019-07-05 MED ORDER — ETOMIDATE 2 MG/ML IV SOLN
INTRAVENOUS | Status: DC | PRN
  Administered 2019-07-05: 15 mg via INTRAVENOUS

## 2019-07-05 MED ORDER — LABETALOL HCL 5 MG/ML IV SOLN
INTRAVENOUS | Status: AC
  Administered 2019-07-05: 5 mg
  Filled 2019-07-05: qty 4

## 2019-07-05 MED ORDER — IODIXANOL 320 MG/ML IV SOLN
INTRAVENOUS | Status: DC | PRN
  Administered 2019-07-05: 230 mL via INTRA_ARTERIAL

## 2019-07-05 MED ORDER — GLYCOPYRROLATE 0.2 MG/ML IJ SOLN
INTRAMUSCULAR | Status: AC
  Filled 2019-07-05: qty 1

## 2019-07-05 MED ORDER — LIDOCAINE HCL (PF) 2 % IJ SOLN
INTRAMUSCULAR | Status: AC
  Filled 2019-07-05: qty 5

## 2019-07-05 MED ORDER — DIPHENHYDRAMINE HCL 50 MG/ML IJ SOLN
50.0000 mg | Freq: Once | INTRAMUSCULAR | Status: DC
  Filled 2019-07-05: qty 1

## 2019-07-05 MED ORDER — ROCURONIUM BROMIDE 50 MG/5ML IV SOLN
INTRAVENOUS | Status: AC
  Filled 2019-07-05: qty 1

## 2019-07-05 MED ORDER — INSULIN ASPART 100 UNIT/ML ~~LOC~~ SOLN
0.0000 [IU] | SUBCUTANEOUS | Status: DC
  Administered 2019-07-06: 2 [IU] via SUBCUTANEOUS
  Administered 2019-07-06: 3 [IU] via SUBCUTANEOUS
  Administered 2019-07-08 – 2019-07-09 (×4): 2 [IU] via SUBCUTANEOUS
  Filled 2019-07-05 (×7): qty 1

## 2019-07-05 MED ORDER — DIPHENHYDRAMINE HCL 50 MG/ML IJ SOLN
INTRAMUSCULAR | Status: DC | PRN
  Administered 2019-07-05: 25 mg via INTRAVENOUS

## 2019-07-05 MED ORDER — HEPARIN SODIUM (PORCINE) 1000 UNIT/ML IJ SOLN
INTRAMUSCULAR | Status: DC | PRN
  Administered 2019-07-05: 3000 [IU] via INTRAVENOUS

## 2019-07-05 MED ORDER — CHLORHEXIDINE GLUCONATE CLOTH 2 % EX PADS
6.0000 | MEDICATED_PAD | Freq: Every day | CUTANEOUS | Status: DC
  Administered 2019-07-05 – 2019-07-09 (×4): 6 via TOPICAL

## 2019-07-05 MED ORDER — ONDANSETRON HCL 4 MG/2ML IJ SOLN
4.0000 mg | Freq: Once | INTRAMUSCULAR | Status: AC
  Administered 2019-07-05: 4 mg via INTRAMUSCULAR

## 2019-07-05 MED ORDER — CEFAZOLIN SODIUM-DEXTROSE 2-3 GM-%(50ML) IV SOLR
INTRAVENOUS | Status: DC | PRN
  Administered 2019-07-05: 2 g via INTRAVENOUS

## 2019-07-05 MED ORDER — FENTANYL BOLUS VIA INFUSION
25.0000 ug | INTRAVENOUS | Status: DC | PRN
  Administered 2019-07-06 – 2019-07-07 (×7): 25 ug via INTRAVENOUS
  Filled 2019-07-05: qty 25

## 2019-07-05 MED ORDER — HYDROCORTISONE NA SUCCINATE PF 250 MG IJ SOLR
200.0000 mg | Freq: Once | INTRAMUSCULAR | Status: DC
  Filled 2019-07-05: qty 200

## 2019-07-05 MED ORDER — VASOPRESSIN 20 UNIT/ML IV SOLN
INTRAVENOUS | Status: DC | PRN
  Administered 2019-07-05: 1 [IU] via INTRAVENOUS
  Administered 2019-07-05: 2 [IU] via INTRAVENOUS
  Administered 2019-07-05 (×2): 1 [IU] via INTRAVENOUS
  Administered 2019-07-05 (×2): 2 [IU] via INTRAVENOUS
  Administered 2019-07-05: 1 [IU] via INTRAVENOUS

## 2019-07-05 SURGICAL SUPPLY — 68 items
APPLIER CLIP 11 MED OPEN (CLIP)
APPLIER CLIP 13 LRG OPEN (CLIP)
APPLIER CLIP 9.375 SM OPEN (CLIP)
BAG DECANTER FOR FLEXI CONT (MISCELLANEOUS) ×3 IMPLANT
BLADE SURG 15 STRL LF DISP TIS (BLADE) ×2 IMPLANT
BLADE SURG 15 STRL SS (BLADE) ×4
BLADE SURG SZ11 CARB STEEL (BLADE) ×3 IMPLANT
BOOT SUTURE AID YELLOW STND (SUTURE) ×3 IMPLANT
BRUSH SCRUB EZ  4% CHG (MISCELLANEOUS) ×2
BRUSH SCRUB EZ 4% CHG (MISCELLANEOUS) ×1 IMPLANT
CLIP APPLIE 11 MED OPEN (CLIP) IMPLANT
CLIP APPLIE 13 LRG OPEN (CLIP) IMPLANT
CLIP APPLIE 9.375 SM OPEN (CLIP) IMPLANT
COVER PROBE U/S 5X48 (MISCELLANEOUS) ×3 IMPLANT
COVER WAND RF STERILE (DRAPES) ×3 IMPLANT
DERMABOND ADVANCED (GAUZE/BANDAGES/DRESSINGS) ×2
DERMABOND ADVANCED .7 DNX12 (GAUZE/BANDAGES/DRESSINGS) ×1 IMPLANT
DRESSING SURGICEL FIBRLLR 1X2 (HEMOSTASIS) IMPLANT
DRSG SURGICEL FIBRILLAR 1X2 (HEMOSTASIS)
DRSG TEGADERM 4X4.75 (GAUZE/BANDAGES/DRESSINGS) ×3 IMPLANT
ELECT CAUTERY BLADE 6.4 (BLADE) ×3 IMPLANT
ELECT REM PT RETURN 9FT ADLT (ELECTROSURGICAL) ×3
ELECTRODE REM PT RTRN 9FT ADLT (ELECTROSURGICAL) ×1 IMPLANT
GAUZE 4X4 16PLY RFD (DISPOSABLE) ×6 IMPLANT
GLOVE BIO SURGEON STRL SZ7 (GLOVE) ×3 IMPLANT
GLOVE SURG SYN 8.0 (GLOVE) ×3 IMPLANT
GOWN STRL REUS W/ TWL LRG LVL3 (GOWN DISPOSABLE) ×2 IMPLANT
GOWN STRL REUS W/ TWL XL LVL3 (GOWN DISPOSABLE) ×2 IMPLANT
GOWN STRL REUS W/TWL LRG LVL3 (GOWN DISPOSABLE) ×4
GOWN STRL REUS W/TWL XL LVL3 (GOWN DISPOSABLE) ×4
IV NS 1000ML (IV SOLUTION) ×2
IV NS 1000ML BAXH (IV SOLUTION) ×1 IMPLANT
KIT CATH CVC 3 LUMEN 7FR 8IN (MISCELLANEOUS) ×3 IMPLANT
LABEL OR SOLS (LABEL) ×3 IMPLANT
LOOP RED MAXI  1X406MM (MISCELLANEOUS) ×4
LOOP VESSEL MAXI 1X406 RED (MISCELLANEOUS) ×2 IMPLANT
LOOP VESSEL MINI 0.8X406 BLUE (MISCELLANEOUS) ×2 IMPLANT
LOOPS BLUE MINI 0.8X406MM (MISCELLANEOUS) ×4
NDL SAFETY ECLIPSE 18X1.5 (NEEDLE) ×1 IMPLANT
NEEDLE HYPO 18GX1.5 SHARP (NEEDLE) ×2
NEEDLE HYPO 25X1 1.5 SAFETY (NEEDLE) ×3 IMPLANT
PACK BASIN MAJOR ARMC (MISCELLANEOUS) ×3 IMPLANT
PENCIL ELECTRO HAND CTR (MISCELLANEOUS) IMPLANT
SPONGE DRAIN TRACH 4X4 STRL 2S (GAUZE/BANDAGES/DRESSINGS) ×3 IMPLANT
SPONGE LAP 18X18 RF (DISPOSABLE) ×6 IMPLANT
SUT MNCRL 4-0 (SUTURE) ×2
SUT MNCRL 4-0 27XMFL (SUTURE) ×1
SUT MNCRL+ 5-0 UNDYED PC-3 (SUTURE) ×2 IMPLANT
SUT MONOCRYL 5-0 (SUTURE) ×4
SUT PROLENE 5 0 RB 1 DA (SUTURE) ×12 IMPLANT
SUT PROLENE 6 0 BV (SUTURE) ×12 IMPLANT
SUT PROLENE 7 0 BV 1 (SUTURE) ×12 IMPLANT
SUT SILK 2 0 (SUTURE) ×2
SUT SILK 2-0 18XBRD TIE 12 (SUTURE) ×1 IMPLANT
SUT SILK 3 0 (SUTURE) ×2
SUT SILK 3-0 18XBRD TIE 12 (SUTURE) ×1 IMPLANT
SUT SILK 4 0 (SUTURE) ×2
SUT SILK 4-0 18XBRD TIE 12 (SUTURE) ×1 IMPLANT
SUT VIC AB 2-0 CT1 (SUTURE) ×18 IMPLANT
SUT VIC AB 4-0 SH 27 (SUTURE) ×4
SUT VIC AB 4-0 SH 27XANBCTRL (SUTURE) ×2 IMPLANT
SUT VICRYL+ 3-0 36IN CT-1 (SUTURE) ×18 IMPLANT
SUTURE MNCRL 4-0 27XMF (SUTURE) ×1 IMPLANT
SYR 10ML LL (SYRINGE) ×3 IMPLANT
SYR 20ML LL LF (SYRINGE) ×3 IMPLANT
SYR 3ML LL SCALE MARK (SYRINGE) IMPLANT
SYR BULB 3OZ (MISCELLANEOUS) IMPLANT
TOWEL OR 17X26 4PK STRL BLUE (TOWEL DISPOSABLE) ×3 IMPLANT

## 2019-07-05 SURGICAL SUPPLY — 36 items
BALLN ULTRVRSE 6X40X130 (BALLOONS) ×3
BALLOON ULTRVRSE 6X40X130 (BALLOONS) ×1 IMPLANT
CANNULA 5F STIFF (CANNULA) ×3 IMPLANT
CATH ACCU-VU SIZ PIG 5F 70CM (CATHETERS) ×3 IMPLANT
CATH BALLN CODA 9X100X32 (BALLOONS) ×3 IMPLANT
CATH BEACON 5 .035 100 H1 TIP (CATHETERS) ×3 IMPLANT
CATH BEACON 5 .035 65 C2 TIP (CATHETERS) ×3 IMPLANT
CATH BEACON 5 .035 65 KMP TIP (CATHETERS) ×3 IMPLANT
DEVICE CLOSURE PERCLS PRGLD 6F (VASCULAR PRODUCTS) ×7 IMPLANT
DEVICE PRESTO INFLATION (MISCELLANEOUS) ×6 IMPLANT
DEVICE SAFEGUARD 24CM (GAUZE/BANDAGES/DRESSINGS) ×6 IMPLANT
DEVICE TORQUE .025-.038 (MISCELLANEOUS) ×3 IMPLANT
DRYSEAL FLEXSHEATH 12FR 33CM (SHEATH) ×2
DRYSEAL FLEXSHEATH 18FR 33CM (SHEATH) ×2
EXCLUDER TNK 28X14.5MMX12CM (Endovascular Graft) ×1 IMPLANT
EXCLUDER TRUNK 28X14.5MMX12CM (Endovascular Graft) ×3 IMPLANT
GLIDECATH 4FR STR (CATHETERS) ×3 IMPLANT
GLIDEWIRE ANGLED SS 035X260CM (WIRE) ×3 IMPLANT
GLIDEWIRE STIFF .35X180X3 HYDR (WIRE) ×3 IMPLANT
GRAFT EXCLUDER AORTIC 28X3.3CM (Endovascular Graft) ×9 IMPLANT
LEG CONTRALATERAL 16X14.5X10 (Vascular Products) ×3 IMPLANT
LEG CONTRALETERAL16X16X11.5 (Endovascular Graft) ×2 IMPLANT
PACK ANGIOGRAPHY (CUSTOM PROCEDURE TRAY) ×3 IMPLANT
PERCLOSE PROGLIDE 6F (VASCULAR PRODUCTS) ×21
SET INTRO CAPELLA COAXIAL (SET/KITS/TRAYS/PACK) ×3 IMPLANT
SHEATH BRITE TIP 6FRX11 (SHEATH) ×6 IMPLANT
SHEATH BRITE TIP 8FRX11 (SHEATH) ×6 IMPLANT
SHEATH DRYSEAL FLEX 12FR 33CM (SHEATH) ×1 IMPLANT
SHEATH DRYSEAL FLEX 18FR 33CM (SHEATH) ×1 IMPLANT
STENT GRAFT CONTRALAT 16X11.5 (Endovascular Graft) ×1 IMPLANT
STENT VIABAHN 6X50X120 (Permanent Stent) ×3 IMPLANT
SYR MEDRAD MARK 7 150ML (SYRINGE) ×3 IMPLANT
TUBING CONTRAST HIGH PRESS 72 (TUBING) ×3 IMPLANT
WIRE AMPLATZ SSTIFF .035X260CM (WIRE) ×6 IMPLANT
WIRE G V18X300CM (WIRE) ×3 IMPLANT
WIRE J 3MM .035X145CM (WIRE) ×6 IMPLANT

## 2019-07-05 NOTE — ED Notes (Signed)
Family at bedside and asked pt if she wishes to have surgery. Pt states " I guess" , MD and cardiologist notified. Family states that she is will respond when attempting to get pt attention by using the name "Brittany Parks" instead of pt first name. They state the she rarely responds when  Brittany Parks when trying to get her attention.Marland Kitchen

## 2019-07-05 NOTE — Consult Note (Signed)
Name: Brittany Parks MRN: [id number] DOB: [birth year]    ADMISSION DATE:  07/13/2019 CONSULTATION DATE: 07/05/2019  REFERRING MD : Dr. Delana Meyer   CHIEF COMPLAINT: Abdominal Pain   BRIEF PATIENT DESCRIPTION:  86 yo female admitted with ruptured infrarenal AAA s/p right renal artery angiography with placement of stent in the right renal artery and proximal aortic cuff with final cuff positioned in a suprarenal orientation remained mechanically intubated post   SIGNIFICANT EVENTS/STUDIES:  02/4: CT Chest/Abd/Pelvis revealed Infrarenal abdominal aortic aneurysm as described with evidence of rupture. Clinical correlation and vascular surgery consult is advised. Emphysema and advanced atherosclerotic calcification of the aorta. Aortic Atherosclerosis (ICD10-I70.0) and Emphysema (ICD10-J43.9). Cholelithiasis. Sigmoid diverticulosis. 02/4: Pt transported to the OR emergently for right renal artery angiography with placement of stent in the right renal artery and proximal aortic cuff with final cuff positioned in a suprarenal orientation. Remained mechanically intubated postop. PCCM consult for vent management   HISTORY OF PRESENT ILLNESS:   This is an 86 yo female with a PMH of AAA, HTN, HLD, COPD, Emphysema, Bladder Cancer, and Left Adrenal Mass.  She presented to [facility] ER via EMS on 02/4 from [facility] with c/o of left sided upper and lower abdominal pain with emesis, onset the afternoon of ER presentation.  In the ER pt extremely diaphoretic with c/o lower back pain with radiation to the lower abdomen.  Pt also markedly hypertensive sbp 180-200's. ER physician performed bedside US which revealed AAA without any clear evidence of rupture or dissection.  However, CTA Chest/Abd/Pelvis revealed infrarenal abdominal aortic aneurysm with evidence of rupture, therefore vascular surgery contacted by ER physician.  Pt transported to the OR emergently for right renal artery angiography with placement  of stent in the right renal artery and proximal aortic cuff with final cuff positioned in a suprarenal orientation.  She remained mechanically intubated postop requiring admission to ICU.  PCCM consulted for mechanical ventilation management.  PAST MEDICAL HISTORY :   has a past medical history of Abdominal aortic aneurysm (AAA) ([place]), Adrenal mass, left ([place]), COPD (chronic obstructive pulmonary disease) ([place]), COPD (chronic obstructive pulmonary disease) ([place]), Emphysema lung ([place]), Hyperlipidemia, Hypertension, and Hypoxia.  has a past surgical history that includes Bladder surgery and Bladder tumor excision. Prior to Admission medications   Medication Sig Start Date End Date Taking? Authorizing Provider  albuterol (VENTOLIN HFA) 108 (90 Base) MCG/ACT inhaler Inhale 2 puffs into the lungs every 6 (six) hours as needed for wheezing or shortness of breath. 05/07/19   Juline Patch, MD  aspirin EC 81 MG tablet Take 1 tablet (81 mg total) by mouth daily. 05/07/19   Juline Patch, MD  telmisartan-hydrochlorothiazide (MICARDIS HCT) 40-12.5 MG tablet Take 1 tablet by mouth daily. 05/07/19   Juline Patch, MD  umeclidinium-vilanterol [facility] ELLIPTA) 62.5-25 MCG/INH AEPB USE 1 INHALATION ORALLY    DAILY 05/22/19   Juline Patch, MD   Allergies  Allergen Reactions   Other Other (See Comments), Itching and Swelling    Uncoded Allergy. Allergen: ivp dye Uncoded Allergy. Allergen: Shellfish Contraindicated due to shellfish allergy Uncoded Allergy. Allergen: Shellfish Uncoded Allergy. Allergen: ivp dye    Penicillins Itching, Other (See Comments) and Swelling    Other Reaction: Other reaction    Shellfish-Derived Products Itching and Swelling   Iodinated Diagnostic Agents     Contraindicated due to shellfish allergy   Azithromycin Rash    FAMILY HISTORY:  family history includes Alcohol abuse in her brother;  Alzheimer's disease in her mother; Aortic aneurysm in her sister; Cancer in her  brother; Heart attack in her father; Hepatomegaly in her brother. SOCIAL HISTORY:  reports that she quit smoking about 23 months ago. Her smoking use included cigarettes. She has a 70.00 pack-year smoking history. She has never used smokeless tobacco. She reports that she does not drink alcohol or use drugs.  REVIEW OF SYSTEMS:   Unable to assess pt intubated   SUBJECTIVE:  Unable to assess pt intubated   VITAL SIGNS: Pulse Rate:  [45-76] 46 (02/04 1820) Resp:  [11-32] 12 (02/04 1820) BP: (118-207)/(51-111) 118/54 (02/04 1820) SpO2:  [90 %-96 %] 95 % (02/04 1820) Weight:  [77.7 kg] 77.7 kg (02/04 1754)  PHYSICAL EXAMINATION: General: acutely ill appearing female, NAD mechanically intubated  Neuro: sedated not following commands, bilateral pupils pinpoint very sluggish HEENT: supple, no JVD  Cardiovascular: sinus brady, no R/G  Lungs: diminished throughout, even, non labored  Abdomen: hypoactive BS x4, obese, soft, non distended  Musculoskeletal: normal bulk and tone, no edema Skin: bilateral groin PAD's no bleeding or hematoma present   Recent Labs  Lab 07/23/2019 1735  NA 134*  K 4.0  CL 98  CO2 26  BUN 17  CREATININE 0.67  GLUCOSE 214*   Recent Labs  Lab 07/12/2019 1735  HGB 13.7  HCT 42.8  WBC 14.0*  PLT 360   CT Angio Chest/Abd/Pel for Dissection W and/or Wo Contrast  Result Date: 07/02/2019 CLINICAL DATA:  86 year old female with abdominal aortic aneurysm. Operative planning. EXAM: CT ANGIOGRAPHY CHEST, ABDOMEN AND PELVIS TECHNIQUE: Multidetector CT imaging through the chest, abdomen and pelvis was performed using the standard protocol during bolus administration of intravenous contrast. Multiplanar reconstructed images and MIPs were obtained and reviewed to evaluate the vascular anatomy. CONTRAST:  2m OMNIPAQUE IOHEXOL 350 MG/ML SOLN COMPARISON:  CT abdomen pelvis dated 03/06/2004. Chest CT dated 11/30/2011. FINDINGS: CTA CHEST FINDINGS Cardiovascular: There is no  cardiomegaly or pericardial effusion. There is coronary vascular calcification of the RCA. There is advanced atherosclerotic calcification of the thoracic aorta. No aneurysmal dilatation or dissection. The origins of the great vessels of the aortic arch appear patent as visualized. There is mild dilatation of the central pulmonary arteries suggestive of a degree of pulmonary hypertension. Evaluation of the pulmonary vessels is limited due to suboptimal opacification and timing of the contrast. Mediastinum/Nodes: There is no hilar or mediastinal adenopathy. There is a small hiatal hernia. The esophagus and the thyroid gland are grossly unremarkable. No mediastinal fluid collection. Lungs/Pleura: There is moderate centrilobular emphysema. No focal consolidation, pleural effusion, or pneumothorax. There is a 5 mm right lower lobe nodule (series 7, image 96). The central airways are patent. Musculoskeletal: Degenerative changes of the spine. No acute osseous pathology. Review of the MIP images confirms the above findings. CTA ABDOMEN AND PELVIS FINDINGS VASCULAR Aorta: There is advanced atherosclerotic calcification of the abdominal aorta. There is a partially thrombosed infrarenal abdominal aortic aneurysm. The aneurysm starts just below the takeoff of the renal arteries and extends through the entire length of the infrarenal aorta just above the aortic bifurcation. It measures approximately 11 cm in craniocaudal length (sagittal series 10, image 109). The maximal diameter of the aneurysm including the thrombosed lumen is approximately 5.7 cm (sagittal series 10, image 109). Several faint foci of high attenuation within the thrombosed portion of the aneurysm are concerning for active intramural bleed (series 6, images 1 20-122). The upper portion of the infrarenal aortic aneurysm measures  approximately 3.8 cm in diameter (sagittal series 10, image 110). There is extraluminal high attenuating fluid adjacent to the  distal aortic aneurysm consistent with blood and in keeping with aneurysm rupture. No definite extraluminal contrast identified. Celiac: There is atherosclerotic calcification of the origin of the celiac axis. The celiac artery may is patent. The left gastric artery appears to origin it directly from the aorta superior to the takeoff of the celiac artery (series 6, image 77). SMA: Patent without evidence of aneurysm, dissection, vasculitis or significant stenosis. Renals: The renal arteries are patent. There is a small accessory renal artery on the right arising from the aorta just above the main renal artery and extending to the upper pole of the right kidney. There is a single dominant artery on the left. IMA: The origin of the IMA is not visualized with certainty. Inflow: Advanced atherosclerotic calcification of the iliac arteries. The iliac arteries remain patent. The right common iliac artery measures 14 mm and the left common iliac artery measures 15 mm in diameter. Veins: No obvious venous abnormality within the limitations of this arterial phase study. Review of the MIP images confirms the above findings. NON-VASCULAR No intraabdominal free air. Hepatobiliary: The liver is unremarkable. No intrahepatic biliary ductal dilatation. There are multiple gallstones. No pericholecystic fluid or evidence of acute cholecystitis by CT. Pancreas: Unremarkable. No pancreatic ductal dilatation or surrounding inflammatory changes. Spleen: Normal in size without focal abnormality. Adrenals/Urinary Tract: Bilateral adrenal adenoma measuring up to 17 mm on the left. There is no hydronephrosis or nephrolithiasis on either side. Several small right renal cysts. The visualized ureters appear unremarkable. The urinary bladder appears slightly lobulated. Stomach/Bowel: There are several sigmoid diverticula without active inflammatory changes. There is no bowel obstruction or active inflammation. Diffusely thickened appearance of  the colon likely related to underdistention. The appendix is not visualized with certainty. No inflammatory changes identified in the right lower quadrant. Lymphatic: No adenopathy. Reproductive: Small calcified uterine fibroid.  No adnexal masses. Other: None Musculoskeletal: Degenerative changes of the spine. No acute osseous pathology. Grade 1 L4-L5 anterolisthesis and multilevel lower lumbar facet arthropathy. Review of the MIP images confirms the above findings. IMPRESSION: 1. Infrarenal abdominal aortic aneurysm as described with evidence of rupture. Clinical correlation and vascular surgery consult is advised. 2. Emphysema and advanced atherosclerotic calcification of the aorta. Aortic Atherosclerosis (ICD10-I70.0) and Emphysema (ICD10-J43.9). 3. Cholelithiasis. 4. Sigmoid diverticulosis. These results were called by telephone at the time of interpretation on 07/07/2019 at 6:15 pm to provider Merlyn Lot , who verbally acknowledged these results. Electronically Signed   By: Anner Crete M.D.   On: 07/26/2019 18:20    ASSESSMENT / PLAN:  Ruptured infrarenal AAA s/p right renal artery angiography with placement of stent in the right renal artery and proximal aortic cuff Acute hypercapnic respiratory failure secondary to sedating medications and COPD  Mechanical Intubation  Full vent support for now-vent setting reviewed and established  SBT once all parameters met VAP bundle implemented  Prn bronchodilator therapy  Vascular surgery consulted appreciate input  Hypotension secondary to hypovolemia and sedating medications  Continuous telemetry monitoring  Aggressive fluid resuscitation and prn dopamine/levophed gtts to maintain map >65  Will r/o sepsis  Hold outpatient antihypertensives for now   Hyperglycemia CBG's q4hrs SSI   Mechanical Intubation pain/discomfort Maintain RASS goal -1 to -2 Fentanyl gtt and prn versed to maintain RASS goal and for pain management  WUA  daily  Best Practice: VTE px: SCD's, avoid chemical prophylaxis for  now  SUP px: iv famotidine  Diet: keep NPO for now   Marda Stalker, [place] Pager [phone] (please enter 7 digits) PCCM Consult Pager [phone] (please enter 7 digits)

## 2019-07-05 NOTE — Op Note (Signed)
OPERATIVE NOTE   PROCEDURE: 1. US guidance for vascular access, bilateral femoral arteries 2. Catheter placement into aorta from right femoral approach and catheter placement into right renal artery from left femoral approach 3. Placement of a 28 x 14 x 12 C3 Gore Excluder Endoprosthesis main body with a 14 x 10 extender andh a 16 x 12 contralateral limb 4. Placement of a 28 mm proximal aortic cuff x3 with the final cuff positioned in a suprarenal orientation. 5. Right renal artery angiography. 6. Placement of a 6 mm x 5 cm Viabahn stent in the right renal artery, in the periscope fashion (in conjunction with the suprarenal proximal extender cuff). 7. ProGlide closure devices bilateral femoral arteries 8. Placement of a right femoral triple-lumen central venous catheter with ultrasound guidance  PRE-OPERATIVE DIAGNOSIS: Ruptured AAA  POST-OPERATIVE DIAGNOSIS: same  SURGEON: Hortencia Pilar, MD and Leotis Pain, MD - Co-surgeons  ANESTHESIA: general  ESTIMATED BLOOD LOSS: 200 cc  FINDING(S): 1.  Ruptured AAA with a sharply angulated neck  SPECIMEN(S):  none  INDICATIONS:   Brittany Parks is a 86 y.o. y.o. female who presents with excruciating back and abdominal pain.  Work-up in the emergency room demonstrated ruptured abdominal aortic aneurysm.  Although the infrarenal neck is well within the range amenable to endovascular repair there is very sharp angulation.  Given the patient's age and comorbidities open surgical repair in this circumstance is not indicated after the family considered the options they have elected to proceed with endovascular repair.  Risks and benefits were reviewed with the family questions were answered and the family has requested we proceed.  DESCRIPTION: After obtaining full informed written consent, the patient was brought back to the operating room and placed supine upon the operating table.  The patient received IV antibiotics prior to induction.   After obtaining adequate anesthesia, the patient was prepped and draped in the standard fashion for endovascular AAA repair.  The right groin is prepped and draped in a sterile fashion.  Ultrasound is placed in a sterile sleeve.  The right common femoral vein is identified is echolucent and compressible indicating patency.  Images recorded for the permanent record.  Under direct ultrasound visualization access is obtained to the common femoral vein with a single stick.  J-wire was then advanced without difficulty.  Dilators advanced over the wire and a triple-lumen catheter is advanced.  All 3 lm aspirated well and flushed easily.  The catheter is then secured with 2-0 silk.  This is in the surgical field and therefore a dressing is applied after the case is completed.  A sterile line was then run from the central line to the head of the table for anesthesia.  Once we had secured appropriate IV access we move forward with the case.    Co-surgeons are required because this is a complex bilateral procedure with work being performed simultaneously from both the right femoral and left femoral approach.  This also expedites the procedure making a shorter operative time reducing complications and improving patient safety.  We then began by gaining access to both femoral arteries with US guidance with me working on the patient's right and Dr. Lucky Cowboy working on the patient's left.  The femoral arteries were found to be patent and accessed without difficulty with a needle under ultrasound guidance without difficulty on each side and permanent images were recorded.  We then placed 2 proglide devices on each side in a pre-close fashion and placed 8 French sheaths.  The patient was then given 3000 units of intravenous heparin.   The Pigtail catheter was placed into the aorta from the right side. Using this image, we selected a 28 x 14 x 12 Main body device.  Over a stiff wire, an 12 French sheath was placed. The main  body was then placed through the 18 French sheath. A Kumpe catheter was placed up the left side and a magnified image at the renal arteries was performed. The main body was then deployed just below the lowest renal artery. The Kumpe catheter was used to cannulate the contralateral gate without difficulty and successful cannulation was confirmed by twirling the pigtail catheter in the main body. We then placed a stiff wire and a retrograde arteriogram was performed through the left femoral sheath. We upsized to the 12 [place] sheath on the left earlier in preparation for the contralateral limb and a 16 x 12 limb was selected and deployed. The main body deployment was then completed.  Next a LAO view of the right iliac bifurcation was obtained and a 14 x 10 extender was opened onto the field advanced up the right side and deployed with the distal end just proximal to the iliac bifurcation.  The pigtail catheter was then advanced up the left side and angiography performed which demonstrated a type I endoleak proximally no evidence of endoleak at the junctions or in the distal landing zones.  Given this finding we elected to place an extender cuff.  The first cuff deployed several millimeters above the proximal margin of the graft but subsequently migrated distally.  Compliant balloon angioplasty was performed and a follow-up image obtained the type I endoleak persisted and therefore a second cuff was deployed.  This appointment was actually slightly higher by several millimeters but once again within a few cardiac cycles the cuff migrated distally.  Again we treated it with the [facility] balloon but follow-up imaging demonstrated persistent type I endoleak.  At this point we felt that we had to get adequate purchase proximally in order to obtain proper seal proximally given this fact we move forward with cannulating the right renal artery and planning for a periscope maneuver.  Using a VS 1 catheter from the left side  Dr. Lucky Cowboy was able to cannulate the right renal artery.  Injection of contrast through the V S1 catheter demonstrated the renal artery anatomy.  AV 18 wire was then advanced out into the renal artery and a 6 mm x 50 mm Viabahn stent selected it was then advanced up the V 18 wire and positioned approximately 15 mm into the right renal artery.  At this point a third 28 mm proximal cuff was advanced and now positioned so that it matched the level of the Viabahn stent.  The Viabahn stent was deployed and then a 6 mm x 40 mm balloon advanced over the wire and positioned within the Viabahn stent.  The proximal cuff was then deployed. All junction points and seals zones were treated with the compliant balloon while simultaneous inflation of the 6 mm balloon was performed.  Pigtail catheter was then reintroduced up the right side and bolus injection of contrast used to demonstrate the stent graft.  At this time there was resolution of the type I endoleak.  There is rapid flow of contrast through both limbs there is patency of the iliac arteries.  There is also contrast filling both the right and left renal arteries.   At this point we elected to terminate  the procedure. We secured the pro glide devices for hemostasis on the femoral arteries. The skin incision was closed with a 4-0 Monocryl. Dermabond and pressure dressing were placed. The patient was transferred to the intensive care unit she will remain intubated.  She has been hemodynamically stable throughout the procedure.  COMPLICATIONS: none  CONDITION: stable  Hortencia Pilar  07/04/2019, 9:19 PM

## 2019-07-05 NOTE — Anesthesia Preprocedure Evaluation (Signed)
Anesthesia Evaluation  Patient identified by MRN, date of birth, ID band Patient awake    Reviewed: Allergy & Precautions, NPO status , Patient's Chart, lab work & pertinent test results  Airway Mallampati: III  TM Distance: <3 FB     Dental  (+) Caps, Chipped   Pulmonary COPD,  COPD inhaler, former smoker,    Pulmonary exam normal        Cardiovascular hypertension, Pt. on medications + Peripheral Vascular Disease  Normal cardiovascular exam     Neuro/Psych negative neurological ROS  negative psych ROS   GI/Hepatic negative GI ROS, Neg liver ROS,   Endo/Other    Renal/GU negative Renal ROS Bladder dysfunction      Musculoskeletal negative musculoskeletal ROS (+)   Abdominal Normal abdominal exam  (+)   Peds negative pediatric ROS (+)  Hematology negative hematology ROS (+)   Anesthesia Other Findings   Reproductive/Obstetrics                             Anesthesia Physical Anesthesia Plan  ASA: IV and emergent  Anesthesia Plan: General   Post-op Pain Management:    Induction: Intravenous, Rapid sequence and Cricoid pressure planned  PONV Risk Score and Plan:   Airway Management Planned: Oral ETT  Additional Equipment:   Intra-op Plan:   Post-operative Plan: Post-operative intubation/ventilation  Informed Consent: I have reviewed the patients History and Physical, chart, labs and discussed the procedure including the risks, benefits and alternatives for the proposed anesthesia with the patient or authorized representative who has indicated his/her understanding and acceptance.     Dental advisory given  Plan Discussed with: CRNA and Surgeon  Anesthesia Plan Comments:         Anesthesia Quick Evaluation

## 2019-07-05 NOTE — ED Triage Notes (Signed)
pt arrives via ems from [facility] with Abd pain in left upper and lower, startin around noon today, emesis started in urgent care around hour ago. appears to be bile.  Known tripple A. On arrival pt repeatedly heaving and throwing up.  pain in back coccyx triangle patter that does not go above waist.EMS administered 58mcg fentanyl 1636 another 29mcg 10 mins later. and 8mg  zofran  Pt extremely diaphoretic and pale MD present for assessment Ems vitals BP 170/100

## 2019-07-05 NOTE — Consult Note (Addendum)
[facility] Admission Note  MRN : [id number]  KAWAILANI RUMER is a 86 y.o. (1930-08-18) female who presents with chief complaint of  Chief Complaint  Patient presents with  . Abdominal Pain   History of Present Illness:   Of Note: Information for this consult was obtained from the emergency room physician, bedside nurse and previous epic notation as the patient was critically ill and unable to communicate.  The patient is an 86 year old female with known history of COPD/emphysema, 5cm AAA, hypertension and hyperlipidemia who presented to the [facility] emergency department complaining of abdominal pain.  Emergency room notation notes that upon arrival the patient was ill-appearing and complaining of abdominal pain associated with low back pain.  The patient symptoms started around 12 PM.  Patient was noted to be in severe abdominal pain and it was difficult for her to sit still.  Patient was also noted to be diaphoretic.  She was tachypneic and short of breath.  CTA chest / abdomen / pelvis: 1. Infrarenal abdominal aortic aneurysm as described with evidence of rupture. Clinical correlation and vascular surgery consult is advised. 2. Emphysema and advanced atherosclerotic calcification of the aorta. Aortic Atherosclerosis (ICD10-I70.0) and Emphysema (ICD10-J43.9). 3. Cholelithiasis. 4. Sigmoid diverticulosis.  At first, the patient's family chose comfort care however then changed their mind.  After discussion with Dr. Delana Meyer and the family, the family (due to the patient being incapacitated) decided to move forward with an endovascular ruptured AAA repair.  Procedure, risks and benefits were explained to the 2 family members present at the bedside.  All questions were answered.  Patient gave their permission to proceed.  The patient was then taken emergently to the OR.  Current Facility-Administered Medications  Medication Dose Route  Frequency Provider Last Rate Last Admin  . [MAR Hold] diphenhydrAMINE (BENADRYL) capsule 50 mg  50 mg Oral Once Merlyn Lot, MD       Or  . Doug Sou Hold] diphenhydrAMINE (BENADRYL) injection 50 mg  50 mg Intravenous Once Merlyn Lot, MD      . Doug Sou Hold] fentaNYL (SUBLIMAZE) injection 50 mcg  50 mcg Intravenous Q1H PRN Merlyn Lot, MD      . Doug Sou Hold] hydrocortisone sodium succinate (SOLU-CORTEF) injection 200 mg  200 mg Intravenous Once Merlyn Lot, MD      . Doug Sou Hold] HYDROmorphone (DILAUDID) injection 0.5 mg  0.5 mg Intravenous Q2H PRN Merlyn Lot, MD   0.5 mg at 07/06/2019 1710  . [MAR Hold] ondansetron (ZOFRAN) injection 4 mg  4 mg Intravenous Once Merlyn Lot, MD       Facility-Administered Medications Ordered in Other Encounters  Medication Dose Route Frequency Provider Last Rate Last Admin  . atropine injection   Intravenous Anesthesia Intra-op Aline Brochure, CRNA   0.4 mg at 07/22/2019 1848  . ePHEDrine injection   Intravenous Anesthesia Intra-op Aline Brochure, CRNA   5 mg at 07/06/2019 1900  . etomidate (AMIDATE) injection   Intravenous Anesthesia Intra-op Aline Brochure, CRNA   15 mg at 07/16/2019 1837  . glycopyrrolate (ROBINUL) injection   Intravenous Anesthesia Intra-op Aline Brochure, CRNA   0.2 mg at 07/12/2019 1841  . hydrocortisone sodium succinate (SOLU-CORTEF) 100 MG injection   Intravenous Anesthesia Intra-op Aline Brochure, CRNA   100 mg at 07/04/2019 1840  . lactated ringers infusion   Intravenous Continuous PRN Aline Brochure, CRNA   New Bag at 07/12/2019 [phone]  . lidocaine (cardiac) 100 mg/76mL (XYLOCAINE) injection 2%   Intravenous Anesthesia  Intra-op Aline Brochure, CRNA   60 mg at 07/29/2019 1836  . rocuronium (ZEMURON) injection   Intravenous Anesthesia Intra-op Aline Brochure, CRNA   30 mg at 07/20/2019 1849  . succinylcholine (ANECTINE) injection   Intravenous Anesthesia Intra-op Aline Brochure, CRNA   100 mg at 07/02/2019  1837  . vasopressin (PITRESSIN) 20 UNIT/ML injection   Intravenous Anesthesia Intra-op Aline Brochure, CRNA   2 Units at 07/29/2019 [id number]   Past Medical History:  Diagnosis Date  . Abdominal aortic aneurysm (AAA) ([place])   . Adrenal mass, left ([place])   . COPD (chronic obstructive pulmonary disease) ([place])   . COPD (chronic obstructive pulmonary disease) ([place])   . Emphysema lung ([place])   . Hyperlipidemia   . Hypertension   . Hypoxia    Past Surgical History:  Procedure Laterality Date  . BLADDER SURGERY    . BLADDER TUMOR EXCISION     Social History Social History   Tobacco Use  . Smoking status: Former Smoker    Packs/day: 1.00    Years: 70.00    Pack years: 70.00    Types: Cigarettes    Quit date: 07/2017    Years since quitting: 1.9  . Smokeless tobacco: Never Used  . Tobacco comment: smoking cessation materials not required  Substance Use Topics  . Alcohol use: No    Alcohol/week: 0.0 standard drinks  . Drug use: No   Family History Family History  Problem Relation Age of Onset  . Alzheimer's disease Mother   . Heart attack Father   . Aortic aneurysm Sister   . Cancer Brother   . Hepatomegaly Brother   . Alcohol abuse Brother   Unable to complete due to critically ill patient  Allergies  Allergen Reactions  . Other Other (See Comments), Itching and Swelling    Uncoded Allergy. Allergen: ivp dye Uncoded Allergy. Allergen: Shellfish Contraindicated due to shellfish allergy Uncoded Allergy. Allergen: Shellfish Uncoded Allergy. Allergen: ivp dye   . Penicillins Itching, Other (See Comments) and Swelling    Other Reaction: Other reaction   . Shellfish-Derived Products Itching and Swelling  . Iodinated Diagnostic Agents     Contraindicated due to shellfish allergy  . Azithromycin Rash   REVIEW OF SYSTEMS (Negative unless checked)  Constitutional: [] Weight loss  [] Fever  [] Chills Cardiac: [] Chest pain   [] Chest pressure   [] Palpitations   [] Shortness of  breath when laying flat   [] Shortness of breath at rest   [] Shortness of breath with exertion. Vascular:  [] Pain in legs with walking   [] Pain in legs at rest   [] Pain in legs when laying flat   [] Claudication   [] Pain in feet when walking  [] Pain in feet at rest  [] Pain in feet when laying flat   [] History of DVT   [] Phlebitis   [] Swelling in legs   [] Varicose veins   [] Non-healing ulcers Pulmonary:   [] Uses home oxygen   [] Productive cough   [] Hemoptysis   [] Wheeze  [] COPD   [] Asthma Neurologic:  [] Dizziness  [] Blackouts   [] Seizures   [] History of stroke   [] History of TIA  [] Aphasia   [] Temporary blindness   [] Dysphagia   [] Weakness or numbness in arms   [] Weakness or numbness in legs Musculoskeletal:  [] Arthritis   [] Joint swelling   [] Joint pain   [] Low back pain Hematologic:  [] Easy bruising  [] Easy bleeding   [] Hypercoagulable state   [] Anemic  [] Hepatitis Gastrointestinal:  [] Blood in stool   [] Vomiting blood  [] Gastroesophageal reflux/heartburn   []   Difficulty swallowing. Genitourinary:  [] Chronic kidney disease   [] Difficult urination  [] Frequent urination  [] Burning with urination   [] Blood in urine Skin:  [] Rashes   [] Ulcers   [] Wounds Psychological:  [] History of anxiety   []  History of major depression.  Unable to complete due to the patient being critically ill.  Physical Examination  Vitals:   07/13/2019 1805 07/11/2019 1811 07/03/2019 1815 07/11/2019 1820  BP: 138/72 (!) 153/57 (!) 121/51 (!) 118/54  Pulse: (!) 50  (!) 45 (!) 46  Resp: 16 16 12 12   SpO2: 90%  91% 95%  Weight:      Height:       Body mass index is 31.33 kg/m. Gen: Critically ill, patient unable to communicate Head: [place]/AT, No temporalis wasting. Prominent temp pulse not noted. Ear/Nose/Throat: Hearing grossly intact, nares w/o erythema or drainage, oropharynx w/o Erythema/Exudate Eyes: Sclera non-icteric, conjunctiva clear Neck: Trachea midline.  No JVD.  Pulmonary: Tachypnea Cardiac: Tachycardic Vascular:   Vessel Right Left  Radial Palpable Palpable  Ulnar Palpable Palpable  Brachial Palpable Palpable                           Gastrointestinal: Distended, tender to palpation. Musculoskeletal: M/S 5/5 throughout.  Extremities without ischemic changes.  No deformity or atrophy. No edema. Neurologic: Sensation grossly intact in extremities.  Symmetrical.   Psychiatric: Unable to ascertain as patient is critically ill Dermatologic: No rashes or ulcers noted.  No cellulitis or open wounds. Lymph : No Cervical, Axillary, or Inguinal lymphadenopathy.  CBC Lab Results  Component Value Date   WBC 14.0 (H) 07/20/2019   HGB 13.7 07/04/2019   HCT 42.8 07/13/2019   MCV 92.0 07/19/2019   PLT 360 07/20/2019   BMET    Component Value Date/Time   NA 134 (L) 07/26/2019 1735   NA 139 05/07/2019 0852   NA 138 11/30/2011 1647   K 4.0 07/04/2019 1735   K 4.0 11/30/2011 1647   CL 98 07/14/2019 1735   CL 101 11/30/2011 1647   CO2 26 07/21/2019 1735   CO2 27 11/30/2011 1647   GLUCOSE 214 (H) 07/09/2019 1735   GLUCOSE 145 (H) 11/30/2011 1647   BUN 17 07/28/2019 1735   BUN 26 05/07/2019 0852   BUN 15 11/30/2011 1647   CREATININE 0.67 07/04/2019 1735   CREATININE 0.79 11/30/2011 1647   CALCIUM 8.9 07/11/2019 1735   CALCIUM 9.0 11/30/2011 1647   GFRNONAA >60 07/13/2019 1735   GFRNONAA >60 11/30/2011 1647   GFRAA >60 07/20/2019 1735   GFRAA >60 11/30/2011 1647   Estimated Creatinine Clearance: 48.7 mL/min (by C-G formula based on SCr of 0.67 mg/dL).  COAG No results found for: INR, PROTIME  Radiology CT Angio Chest/Abd/Pel for Dissection W and/or Wo Contrast  Result Date: 07/29/2019 CLINICAL DATA:  86 year old female with abdominal aortic aneurysm. Operative planning. EXAM: CT ANGIOGRAPHY CHEST, ABDOMEN AND PELVIS TECHNIQUE: Multidetector CT imaging through the chest, abdomen and pelvis was performed using the standard protocol during bolus administration of intravenous contrast.  Multiplanar reconstructed images and MIPs were obtained and reviewed to evaluate the vascular anatomy. CONTRAST:  38mL OMNIPAQUE IOHEXOL 350 MG/ML SOLN COMPARISON:  CT abdomen pelvis dated 03/06/2004. Chest CT dated 11/30/2011. FINDINGS: CTA CHEST FINDINGS Cardiovascular: There is no cardiomegaly or pericardial effusion. There is coronary vascular calcification of the RCA. There is advanced atherosclerotic calcification of the thoracic aorta. No aneurysmal dilatation or dissection. The origins of the great vessels  of the aortic arch appear patent as visualized. There is mild dilatation of the central pulmonary arteries suggestive of a degree of pulmonary hypertension. Evaluation of the pulmonary vessels is limited due to suboptimal opacification and timing of the contrast. Mediastinum/Nodes: There is no hilar or mediastinal adenopathy. There is a small hiatal hernia. The esophagus and the thyroid gland are grossly unremarkable. No mediastinal fluid collection. Lungs/Pleura: There is moderate centrilobular emphysema. No focal consolidation, pleural effusion, or pneumothorax. There is a 5 mm right lower lobe nodule (series 7, image 96). The central airways are patent. Musculoskeletal: Degenerative changes of the spine. No acute osseous pathology. Review of the MIP images confirms the above findings. CTA ABDOMEN AND PELVIS FINDINGS VASCULAR Aorta: There is advanced atherosclerotic calcification of the abdominal aorta. There is a partially thrombosed infrarenal abdominal aortic aneurysm. The aneurysm starts just below the takeoff of the renal arteries and extends through the entire length of the infrarenal aorta just above the aortic bifurcation. It measures approximately 11 cm in craniocaudal length (sagittal series 10, image 109). The maximal diameter of the aneurysm including the thrombosed lumen is approximately 5.7 cm (sagittal series 10, image 109). Several faint foci of high attenuation within the thrombosed  portion of the aneurysm are concerning for active intramural bleed (series 6, images 1 20-122). The upper portion of the infrarenal aortic aneurysm measures approximately 3.8 cm in diameter (sagittal series 10, image 110). There is extraluminal high attenuating fluid adjacent to the distal aortic aneurysm consistent with blood and in keeping with aneurysm rupture. No definite extraluminal contrast identified. Celiac: There is atherosclerotic calcification of the origin of the celiac axis. The celiac artery may is patent. The left gastric artery appears to origin it directly from the aorta superior to the takeoff of the celiac artery (series 6, image 77). SMA: Patent without evidence of aneurysm, dissection, vasculitis or significant stenosis. Renals: The renal arteries are patent. There is a small accessory renal artery on the right arising from the aorta just above the main renal artery and extending to the upper pole of the right kidney. There is a single dominant artery on the left. IMA: The origin of the IMA is not visualized with certainty. Inflow: Advanced atherosclerotic calcification of the iliac arteries. The iliac arteries remain patent. The right common iliac artery measures 14 mm and the left common iliac artery measures 15 mm in diameter. Veins: No obvious venous abnormality within the limitations of this arterial phase study. Review of the MIP images confirms the above findings. NON-VASCULAR No intraabdominal free air. Hepatobiliary: The liver is unremarkable. No intrahepatic biliary ductal dilatation. There are multiple gallstones. No pericholecystic fluid or evidence of acute cholecystitis by CT. Pancreas: Unremarkable. No pancreatic ductal dilatation or surrounding inflammatory changes. Spleen: Normal in size without focal abnormality. Adrenals/Urinary Tract: Bilateral adrenal adenoma measuring up to 17 mm on the left. There is no hydronephrosis or nephrolithiasis on either side. Several small right  renal cysts. The visualized ureters appear unremarkable. The urinary bladder appears slightly lobulated. Stomach/Bowel: There are several sigmoid diverticula without active inflammatory changes. There is no bowel obstruction or active inflammation. Diffusely thickened appearance of the colon likely related to underdistention. The appendix is not visualized with certainty. No inflammatory changes identified in the right lower quadrant. Lymphatic: No adenopathy. Reproductive: Small calcified uterine fibroid.  No adnexal masses. Other: None Musculoskeletal: Degenerative changes of the spine. No acute osseous pathology. Grade 1 L4-L5 anterolisthesis and multilevel lower lumbar facet arthropathy. Review of the MIP  images confirms the above findings. IMPRESSION: 1. Infrarenal abdominal aortic aneurysm as described with evidence of rupture. Clinical correlation and vascular surgery consult is advised. 2. Emphysema and advanced atherosclerotic calcification of the aorta. Aortic Atherosclerosis (ICD10-I70.0) and Emphysema (ICD10-J43.9). 3. Cholelithiasis. 4. Sigmoid diverticulosis. These results were called by telephone at the time of interpretation on 07/06/2019 at 6:15 pm to provider Merlyn Lot , who verbally acknowledged these results. Electronically Signed   By: Anner Crete M.D.   On: 07/18/2019 18:20   Assessment/Plan The patient is an 86 year old female with known history of COPD/emphysema, 5cm AAA, hypertension and hyperlipidemia who presented to the [facility] emergency department complaining of abdominal pain. CTA of the chest, abdomen pelvis were notable for an infrarenal abdominal aortic aneurysm with evidence of rupture.  Vascular surgery was consulted by the emergency room physician emergently for intervention.  At first, the patient's family chose comfort care however then changed their mind.    After discussion a discussion between Dr. Delana Meyer and the family, the family  (due to the patient being incapacitated) decided to move forward with an endovascular ruptured AAA repair.  Procedure, risks and benefits were explained to the two family members present at the bedside.  All questions were answered.  Patient gave their permission to proceed.    The patient was then taken emergently to the OR.  Seen and examined with Dr. Francene Castle, PA-C  07/15/2019 7:02 PM  This note was created with Dragon medical transcription system.  Any error is purely unintentional

## 2019-07-05 NOTE — Op Note (Signed)
OPERATIVE NOTE   PROCEDURE: 1. US guidance for vascular access, bilateral femoral arteries 2. Catheter placement into aorta from bilateral femoral approaches and into the right renal artery from the left femoral approach 3. Placement of a 28 mm proximal 14 cm length Gore Excluder Endoprosthesis main body right with a 16 mm diameter by 12 cm length left contralateral limb 4. Placement of right iliac extension limb with a 14 mm diameter by 10 cm length limb 5. Placement of proximal aortic cuff x3 with 28 mm cuffs with the most proximal cuff being suprarenal 6. Right renal angiography 7. Placement of a right renal artery stent with a 6 mm diameter by 5 cm length Viabahn stent for a periscope maneuver combined with placing of a proximal aortic cuff proximal to the right renal artery origin 8. ProGlide closure devices bilateral femoral arteries  PRE-OPERATIVE DIAGNOSIS: AAA, ruptured  POST-OPERATIVE DIAGNOSIS: same  SURGEON: Brittany Pain, MD and Brittany Pilar, MD - Co-surgeons  ANESTHESIA: General  ESTIMATED BLOOD LOSS: 100 cc  FINDING(S): 1.  AAA  SPECIMEN(S):  none  INDICATIONS:   Brittany Parks is a 86 y.o. female who presents with a ruptured abdominal aortic aneurysm. The anatomy was suitable for endovascular repair although the proximal neck was quite angulated and tapered.  This was still felt to be her best chance of survival.  Risks and benefits of repair in an endovascular fashion were discussed and informed consent was obtained. Co-surgeons are used to expedite the procedure and reduce operative time as bilateral work needs to be done.  DESCRIPTION: After obtaining full informed written consent, the patient was brought back to the operating room and placed supine upon the operating table.  The patient received IV antibiotics prior to induction.  After obtaining adequate anesthesia, the patient was prepped and draped in the standard fashion for endovascular AAA repair.   We then began by gaining access to both femoral arteries with US guidance with me working on the left and Dr. Delana Parks working on the right.  The femoral arteries were found to be patent and accessed without difficulty with a needle under ultrasound guidance without difficulty on each side and permanent images were recorded.  We then placed 2 proglide devices on each side in a pre-close fashion and placed 8 French sheaths. The patient was then given 3000 units of intravenous heparin. The Pigtail catheter was placed into the aorta from the right side. Using this image, we selected a 28 mm diameter by 14 cm length Main body device.  Over a stiff wire, an 57 French sheath was placed up the right. The main body was then placed through the 18 French sheath. A Kumpe catheter was placed up the left side and a magnified image at the renal arteries was performed. The main body was then deployed just below the lowest renal artery. The Kumpe catheter was used to cannulate the contralateral gate without difficulty and successful cannulation was confirmed by twirling the pigtail catheter in the main body. We then placed a stiff wire and a retrograde arteriogram was performed through the left femoral sheath. We upsized to the 12 [place] sheath for the contralateral limb and a 16 mm diameter by 12 cm length left iliac limb was selected and deployed. The main body deployment was then completed. Based off the angiographic findings, extension limbs were necessary.  A 14 mm diameter by 10 cm length right iliac extension limb was taken down to just above the hypogastric artery. All  junction points and seals zones were treated with the compliant balloon. The pigtail catheter was then replaced and a completion angiogram was performed.  A proximal type I endoleak was detected on completion angiography. The renal arteries were found to be widely patent.  We then deployed 2 more 28 mm diameter cuffs to try to gain seal proximally and stay  below the renal arteries.  Despite this, there remained a proximal type I endoleak which was not acceptable and a ruptured situation.  At this point, I cannulated the right renal artery with a V S1 catheter from left femoral approach and perform selective renal angiography.  We were able to place a V 18 wire well out the renal artery and advance a 6 mm diameter by 5 cm length Viabahn stent about 1-1/2 cm into the renal artery and back down into the proximal portion of the stent graft and a periscope maneuver.  I deployed the stent and then took a 6 mm balloon up to post dilate.  An additional 28 mm aortic cuff was then taken proximally above the origin of the right renal artery and across the base of the left renal artery as well.  The 28 mm aortic cuff was deployed in a suprarenal location.  The compliant balloon was then used to iron out the aortic cuff and a 6 mm balloon was inflated in the right renal artery simultaneously.  Completion imaging was then performed which showed no endoleak with an excellent proximal seal and the right renal artery stent was now widely patent.  There was still flow in the left renal artery although the aneurysm stent graft was clearly across the majority of the origin and this was likely not salvageable.  Given the rupture situation and the difficulty of the case, this was her only chance at survival and we felt this was clearly necessary to get proximal seal. At this point we elected to terminate the procedure. We secured the pro glide devices for hemostasis on the femoral arteries. The skin incision was closed with a 4-0 Monocryl. Dermabond and pressure dressing were placed. The patient was taken to the recovery room in stable condition having tolerated the procedure well.  COMPLICATIONS: none  CONDITION: stable  Brittany Parks  07/23/2019, 9:07 PM   This note was created with Dragon Medical transcription system. Any errors in dictation are purely unintentional.

## 2019-07-05 NOTE — Anesthesia Procedure Notes (Signed)
Arterial Line Insertion Start/End02/10/2019 7:05 PM, 07/05/2019 7:08 PM Performed by: Alvin Critchley, MD, anesthesiologist  Patient location: OR. Preanesthetic checklist: patient identified, IV checked, site marked, risks and benefits discussed, surgical consent, pre-op evaluation and timeout performed Patient sedated Left, radial was placed Catheter size: 20 G Hand hygiene performed   Attempts: 2 Procedure performed without using ultrasound guided technique. Following insertion, dressing applied. Patient tolerated the procedure well with no immediate complications.

## 2019-07-05 NOTE — ED Provider Notes (Signed)
[facility]    CSN: [id number] Arrival date & time: 07/06/2019  1546   History   Chief Complaint Chief Complaint  Patient presents with  . Abdominal Pain  . Back Pain   HPI  86 year old female with known COPD/emphysema as well as a 5 cm AAA presents with abdominal pain, nausea, vomiting, and back pain.  Upon arrival here, patient is ill-appearing and is complaining of abdominal pain and associated low back pain.  She states that it started around 12:00 PM.  She reports one episode of emesis.  She continues to be nauseated.  Patient is writhing around in pain throughout history and exam.  Difficult to get her to sit still.  She rates her pain as 9/10 in severity.  Patient diaphoretic.  No reports of chest pain.  She is tachypneic and appears short of breath.  No relieving factors.  No known inciting factor.  No other reported symptoms.    Of note, patient has taken 2 aspirin today.  Past Medical History:  Diagnosis Date  . Abdominal aortic aneurysm (AAA) ([place])   . Adrenal mass, left ([place])   . COPD (chronic obstructive pulmonary disease) ([place])   . COPD (chronic obstructive pulmonary disease) ([place])   . Emphysema lung ([place])   . Hyperlipidemia   . Hypertension   . Hypoxia    Patient Active Problem List   Diagnosis Date Noted  . Centrilobular emphysema ([place]) 11/18/2017  . Hypoxia 08/04/2016  . Pyuria 03/05/2015  . Malignant neoplasm of overlapping sites of bladder ([place]) 02/05/2015  . Abdominal aortic aneurysm greater than 39 mm in diameter ([place]) 01/23/2015  . Adrenal mass, left ([place]) 01/23/2015  . Bladder neoplasm of uncertain malignant potential 01/23/2015  . Kidney filling defect 01/23/2015  . Lymphadenopathy, abdominal 01/23/2015  . Urinary retention 01/15/2015  . Difficulty in urination 01/14/2015  . Gross hematuria 01/14/2015  . History of nephrolithiasis 01/14/2015  . Incomplete emptying of bladder 01/14/2015  . COPD exacerbation ([place]) 12/05/2014  . Essential  hypertension 12/05/2014  . Hyperlipidemia 12/05/2014   Past Surgical History:  Procedure Laterality Date  . BLADDER SURGERY    . BLADDER TUMOR EXCISION      OB History   No obstetric history on file.    Home Medications    Prior to Admission medications   Medication Sig Start Date End Date Taking? Authorizing Provider  albuterol (VENTOLIN HFA) 108 (90 Base) MCG/ACT inhaler Inhale 2 puffs into the lungs every 6 (six) hours as needed for wheezing or shortness of breath. 05/07/19  Yes Juline Patch, MD  aspirin EC 81 MG tablet Take 1 tablet (81 mg total) by mouth daily. 05/07/19  Yes Juline Patch, MD  telmisartan-hydrochlorothiazide (MICARDIS HCT) 40-12.5 MG tablet Take 1 tablet by mouth daily. 05/07/19  Yes Juline Patch, MD  umeclidinium-vilanterol [facility] ELLIPTA) 62.5-25 MCG/INH AEPB USE 1 INHALATION ORALLY    DAILY 05/22/19  Yes Juline Patch, MD    Family History Family History  Problem Relation Age of Onset  . Alzheimer's disease Mother   . Heart attack Father   . Aortic aneurysm Sister   . Cancer Brother   . Hepatomegaly Brother   . Alcohol abuse Brother     Social History Social History   Tobacco Use  . Smoking status: Former Smoker    Packs/day: 1.00    Years: 70.00    Pack years: 70.00    Types: Cigarettes    Quit date: 07/2017  Years since quitting: 1.9  . Smokeless tobacco: Never Used  . Tobacco comment: smoking cessation materials not required  Substance Use Topics  . Alcohol use: No    Alcohol/week: 0.0 standard drinks  . Drug use: No     Allergies   Other, Penicillins, Shellfish-derived products, Iodinated diagnostic agents, and Azithromycin   Review of Systems Review of Systems  Constitutional: Positive for diaphoresis.  Gastrointestinal: Positive for abdominal pain.  Musculoskeletal: Positive for back pain.   Physical Exam Triage Vital Signs ED Triage Vitals  Enc Vitals Group     BP 07/02/2019 1621 (!) 207/94     Pulse Rate  07/28/2019 1621 76     Resp 07/15/2019 1600 (!) 32     Temp --      Temp src --      SpO2 07/04/2019 1600 92 %     Weight --      Height --      Head Circumference --      Peak Flow --      Pain Score 07/21/2019 1600 9     Pain Loc --      Pain Edu? --      Excl. in [place]? --    Updated Vital Signs BP (!) 207/94 (BP Location: Left Arm)   Pulse 76   Resp (!) 32   SpO2 92%   Visual Acuity Right Eye Distance:   Left Eye Distance:   Bilateral Distance:    Right Eye Near:   Left Eye Near:    Bilateral Near:     Physical Exam Constitutional:      Appearance: She is diaphoretic.     Comments: Elderly female, chronically ill-appearing.  Appears in distress secondary to pain.  Diaphoretic.  She is alert.  HENT:     Head: Normocephalic and atraumatic.  Eyes:     General:        Right eye: No discharge.        Left eye: No discharge.     Conjunctiva/sclera: Conjunctivae normal.  Cardiovascular:     Rate and Rhythm: Normal rate and regular rhythm.     Heart sounds: No murmur.  Pulmonary:     Breath sounds: No wheezing.     Comments: Tachypneic. Abdominal:     Palpations: Abdomen is soft.     Comments: Nondistended.  Diffuse lower abdominal pain noted on exam.  No rebound or guarding.  Skin:    General: Skin is warm.     Findings: No rash.  Neurological:     Comments: Alert.  No apparent focal deficits.     UC Treatments / Results  Labs (all labs ordered are listed, but only abnormal results are displayed) Labs Reviewed - No data to display  EKG   Radiology No results found.  Procedures Procedures (including critical care time)  Medications Ordered in UC Medications  sodium chloride 0.9 % bolus 500 mL (500 mLs Intravenous New Bag/Given 07/24/2019 1618)  ondansetron (ZOFRAN) injection 4 mg (4 mg Intramuscular Given 07/09/2019 1618)    Initial Impression / Assessment and Plan / UC Course  I have reviewed the triage vital signs and the nursing notes.  Pertinent labs &  imaging results that were available during my care of the patient were reviewed by me and considered in my medical decision making (see chart for details).    86 year old female presents with acute, severe abdominal pain with associated back pain.  She is ill-appearing and is diaphoretic.  She is also tachypneic.  Etiology and prognosis is uncertain at this time.  Concern for underlying acute abdominal process.  This poses a potential threat to life/bodily function.  Patient is being transported via EMS to the hospital for further evaluation and treatment (needs higher level of care).  IV was placed.  Zofran was given.  Final Clinical Impressions(s) / UC Diagnoses   Final diagnoses:  Diffuse abdominal pain  Diaphoresis  Tachypnea   Discharge Instructions   None    ED Prescriptions    None     PDMP not reviewed this encounter.   Coral Spikes, [place] 07/03/2019 1644

## 2019-07-05 NOTE — ED Provider Notes (Signed)
[facility] Emergency Department Provider Note    None    (approximate)  I have reviewed the triage vital signs and the nursing notes.   HISTORY  Chief Complaint Abdominal Pain    HPI Brittany Parks is a 86 y.o. female below listed past medical history presents to the ER for evaluation of severe abdominal pain chest pain and back pain.  Does have a history of known AAA.  History of adrenal mass as well.  Symptoms started today.  Pain reported as severe.  Patient also markedly hypertensive.   Protecting her airway.  Requesting something for pain.  States that she is a DNR would not want CPR or to be intubated.   Past Medical History:  Diagnosis Date  . Abdominal aortic aneurysm (AAA) ([place])   . Adrenal mass, left ([place])   . COPD (chronic obstructive pulmonary disease) ([place])   . COPD (chronic obstructive pulmonary disease) ([place])   . Emphysema lung ([place])   . Hyperlipidemia   . Hypertension   . Hypoxia    Family History  Problem Relation Age of Onset  . Alzheimer's disease Mother   . Heart attack Father   . Aortic aneurysm Sister   . Cancer Brother   . Hepatomegaly Brother   . Alcohol abuse Brother    Past Surgical History:  Procedure Laterality Date  . BLADDER SURGERY    . BLADDER TUMOR EXCISION     Patient Active Problem List   Diagnosis Date Noted  . Centrilobular emphysema ([place]) 11/18/2017  . Hypoxia 08/04/2016  . Pyuria 03/05/2015  . Malignant neoplasm of overlapping sites of bladder ([place]) 02/05/2015  . Abdominal aortic aneurysm greater than 39 mm in diameter ([place]) 01/23/2015  . Adrenal mass, left ([place]) 01/23/2015  . Bladder neoplasm of uncertain malignant potential 01/23/2015  . Kidney filling defect 01/23/2015  . Lymphadenopathy, abdominal 01/23/2015  . Urinary retention 01/15/2015  . Difficulty in urination 01/14/2015  . Gross hematuria 01/14/2015  . History of nephrolithiasis 01/14/2015  . Incomplete emptying of bladder  01/14/2015  . COPD exacerbation ([place]) 12/05/2014  . Essential hypertension 12/05/2014  . Hyperlipidemia 12/05/2014      Prior to Admission medications   Medication Sig Start Date End Date Taking? Authorizing Provider  albuterol (VENTOLIN HFA) 108 (90 Base) MCG/ACT inhaler Inhale 2 puffs into the lungs every 6 (six) hours as needed for wheezing or shortness of breath. 05/07/19   Juline Patch, MD  aspirin EC 81 MG tablet Take 1 tablet (81 mg total) by mouth daily. 05/07/19   Juline Patch, MD  telmisartan-hydrochlorothiazide (MICARDIS HCT) 40-12.5 MG tablet Take 1 tablet by mouth daily. 05/07/19   Juline Patch, MD  umeclidinium-vilanterol [facility] ELLIPTA) 62.5-25 MCG/INH AEPB USE 1 INHALATION ORALLY    DAILY 05/22/19   Juline Patch, MD    Allergies Other, Penicillins, Shellfish-derived products, Iodinated diagnostic agents, and Azithromycin    Social History Social History   Tobacco Use  . Smoking status: Former Smoker    Packs/day: 1.00    Years: 70.00    Pack years: 70.00    Types: Cigarettes    Quit date: 07/2017    Years since quitting: 1.9  . Smokeless tobacco: Never Used  . Tobacco comment: smoking cessation materials not required  Substance Use Topics  . Alcohol use: No    Alcohol/week: 0.0 standard drinks  . Drug use: No    Review of Systems Patient denies headaches, rhinorrhea, blurry vision, numbness,  shortness of breath, chest pain, edema, cough, abdominal pain, nausea, vomiting, diarrhea, dysuria, fevers, rashes or hallucinations unless otherwise stated above in HPI. ____________________________________________   PHYSICAL EXAM:  VITAL SIGNS: Vitals:   07/16/2019 1815 07/13/2019 1820  BP: (!) 121/51 (!) 118/54  Pulse: (!) 45 (!) 46  Resp: 12 12  SpO2: 91% 95%    Constitutional: Alert and oriented. Very uncomfortable appearing Eyes: Conjunctivae are normal.  Head: Atraumatic. Nose: No congestion/rhinnorhea. Mouth/Throat: Mucous membranes are  moist.   Neck: No stridor. Painless ROM.  Cardiovascular: Normal rate, regular rhythm. Grossly normal heart sounds.  Good peripheral circulation. Respiratory: Normal respiratory effort.  No retractions. Lungs CTAB. Gastrointestinal: Soft but diffusely tender No distention. + abdominal bruit, pulsatile mass Genitourinary:  Musculoskeletal: No lower extremity tenderness nor edema.  No joint effusions. Neurologic:   No gross focal neurologic deficits are appreciated. No facial droop Skin:  Skin is warm, dry and intact. No rash noted. Psychiatric: anxious appearing  ____________________________________________   LABS (all labs ordered are listed, but only abnormal results are displayed)  Results for orders placed or performed during the hospital encounter of 07/14/2019 (from the past 24 hour(s))  CBC with Differential/Platelet     Status: Abnormal   Collection Time: 07/28/2019  5:35 PM  Result Value Ref Range   WBC 14.0 (H) 4.0 - 10.5 K/uL   RBC 4.65 3.87 - 5.11 MIL/uL   Hemoglobin 13.7 12.0 - 15.0 g/dL   HCT 42.8 36.0 - 46.0 %   MCV 92.0 80.0 - 100.0 fL   MCH 29.5 26.0 - 34.0 pg   MCHC 32.0 30.0 - 36.0 g/dL   RDW 13.2 11.5 - 15.5 %   Platelets 360 150 - 400 K/uL   nRBC 0.0 0.0 - 0.2 %   Neutrophils Relative % 86 %   Neutro Abs 12.3 (H) 1.7 - 7.7 K/uL   Lymphocytes Relative 9 %   Lymphs Abs 1.2 0.7 - 4.0 K/uL   Monocytes Relative 3 %   Monocytes Absolute 0.4 0.1 - 1.0 K/uL   Eosinophils Relative 1 %   Eosinophils Absolute 0.1 0.0 - 0.5 K/uL   Basophils Relative 0 %   Basophils Absolute 0.0 0.0 - 0.1 K/uL   Immature Granulocytes 1 %   Abs Immature Granulocytes 0.09 (H) 0.00 - 0.07 K/uL  Comprehensive metabolic panel     Status: Abnormal   Collection Time: 07/19/2019  5:35 PM  Result Value Ref Range   Sodium 134 (L) 135 - 145 mmol/L   Potassium 4.0 3.5 - 5.1 mmol/L   Chloride 98 98 - 111 mmol/L   CO2 26 22 - 32 mmol/L   Glucose, Bld 214 (H) 70 - 99 mg/dL   BUN 17 8 - 23 mg/dL    Creatinine, Ser 0.67 0.44 - 1.00 mg/dL   Calcium 8.9 8.9 - 10.3 mg/dL   Total Protein 6.6 6.5 - 8.1 g/dL   Albumin 3.1 (L) 3.5 - 5.0 g/dL   AST 13 (L) 15 - 41 U/L   ALT 10 0 - 44 U/L   Alkaline Phosphatase 70 38 - 126 U/L   Total Bilirubin 0.8 0.3 - 1.2 mg/dL   GFR calc non Af Amer >60 >60 mL/min   GFR calc Af Amer >60 >60 mL/min   Anion gap 10 5 - 15  Troponin I (High Sensitivity)     Status: None   Collection Time: 07/29/2019  5:35 PM  Result Value Ref Range   Troponin I (High Sensitivity)  6 <18 ng/L  Type and screen [place]     Status: None (Preliminary result)   Collection Time: 07/11/2019  5:37 PM  Result Value Ref Range   ABO/RH(D) PENDING    Antibody Screen PENDING    Sample Expiration      07/08/2019,2359 Performed at [facility], [address] [place], [place] [address]    ____________________________________________  EKG My review and personal interpretation at Time: 17:12   Indication: chest pain  Rate: 65  Rhythm: sinus Axis: normal Other: prolonged qt, nonspecific st abn, no stemi ____________________________________________  RADIOLOGY  EMERGENCY DEPARTMENT ULTRASOUND  Study: Limited Retroperitoneal Ultrasound of the Abdominal Aorta.  INDICATIONS:Abnormal vital signs, Pulsatile abdominal mass, Abdominal pain, Back pain and Age>55 Multiple views of the abdominal aorta were obtained in real-time from the diaphragmatic hiatus to the aortic bifurcation in transverse planes with a multi-frequency probe.  PERFORMED BY: Myself IMAGES ARCHIVED?: No LIMITATIONS:  Abdominal pain INTERPRETATION:  Abdominal aortic aneurysm present - diameter dimensions 5.33    ____________________________________________   PROCEDURES  Procedure(s) performed:  .Critical Care Performed by: Merlyn Lot, MD Authorized by: Merlyn Lot, MD   Critical care provider statement:    Critical care time (minutes):  45   Critical care time  was exclusive of:  Separately billable procedures and treating other patients   Critical care was necessary to treat or prevent imminent or life-threatening deterioration of the following conditions:  Circulatory failure   Critical care was time spent personally by me on the following activities:  Development of treatment plan with patient or surrogate, discussions with consultants, evaluation of patient's response to treatment, examination of patient, obtaining history from patient or surrogate, ordering and performing treatments and interventions, ordering and review of laboratory studies, ordering and review of radiographic studies, pulse oximetry, re-evaluation of patient's condition and review of old charts      Critical Care performed: yes ____________________________________________   INITIAL IMPRESSION / [place] / ED COURSE  Pertinent labs & imaging results that were available during my care of the patient were reviewed by me and considered in my medical decision making (see chart for details).   DDX: AAA, dissection, perforation, stone, pancreatitis, mesenteric ischemia  Brittany Parks is a 86 y.o. who presents to the ED with symptoms as described above.  Patient critically ill-appearing markedly hypertensive.  Also with hypoxic respiratory failure that this might be secondary to narcotic medications.  She is currently protecting her airway.  Certainly concerning for aortic pathology.  Bedside ultrasound shows AAA I do not see any clear evidence of rupture or dissection flap will take to CT as she is also having chest pain to exclude more proximal dissection or pathology.  Will give additional pain medication.  Clinical Course as of Jul 05 1823  Thu Jul 05, 2019  1739 Case discussed in consultation with Dr.Schneir of vascular.  Imaging concerning for ruptured/expanding AAA.   Plan emergent OR.   [PR]  [id number] Patient's son at bedside and family state that her wishes are  not to proceed with surgery and that she would prefer to be made comfortable at this time.   [PR]  [id number] Family and patient states that they are doing to proceed with operative intervention. Dr. Delana Meyer is evaluated patient at bedside as we speak.   [PR]  1801 Patient appears more comfortable at this moment.   [PR]    Clinical Course User Index [PR] Merlyn Lot, MD   Hemodynamics stabilizing.  Appears more comfortable blood pressure better controlled.  Protecting her airway.  Patient taken to the OR.  The patient was evaluated in Emergency Department today for the symptoms described in the history of present illness. He/she was evaluated in the context of the global COVID-19 pandemic, which necessitated consideration that the patient might be at risk for infection with the SARS-CoV-2 virus that causes COVID-19. Institutional protocols and algorithms that pertain to the evaluation of patients at risk for COVID-19 are in a state of rapid change based on information released by regulatory bodies including the CDC and federal and state organizations. These policies and algorithms were followed during the patient's care in the ED.  As part of my medical decision making, I reviewed the following data within the [place] notes reviewed and incorporated, Labs reviewed, notes from prior ED visits and [place] Controlled Substance Database   ____________________________________________   FINAL CLINICAL IMPRESSION(S) / ED DIAGNOSES  Final diagnoses:  Ruptured abdominal aortic aneurysm (AAA) [facility])  Hypertensive emergency      NEW MEDICATIONS STARTED DURING THIS VISIT:  Current Discharge Medication List       Note:  This document was prepared using Dragon voice recognition software and may include unintentional dictation errors.    Merlyn Lot, MD 07/09/2019 [phone]

## 2019-07-05 NOTE — Transfer of Care (Signed)
Immediate Anesthesia Transfer of Care Note  Patient: Brittany Parks  Procedure(s) Performed: ABDOMINAL AORTIC ENDOVASCULAR STENT GRAFT with right renal stent placement, right femoral vein central line placement (N/A )  Patient Location: ICU  Anesthesia Type:General  Level of Consciousness: sedated and Patient remains intubated per anesthesia plan  Airway & Oxygen Therapy: Patient placed on Ventilator (see vital sign flow sheet for setting)  Post-op Assessment: Post -op Vital signs reviewed and stable  Post vital signs: stable  Last Vitals:  Vitals Value Taken Time  BP 103/57 07/09/2019 2146  Temp    Pulse 54 07/20/2019 2146  Resp 10 07/18/2019 2146  SpO2 97 % 07/03/2019 2146    Last Pain:  Vitals:   07/18/2019 1754  PainSc: 10-Worst pain ever         Complications: No apparent anesthesia complications

## 2019-07-05 NOTE — Anesthesia Procedure Notes (Addendum)
Procedure Name: Intubation Date/Time: 07/26/2019 6:39 PM Performed by: Aline Brochure, CRNA Pre-anesthesia Checklist: Patient identified, Emergency Drugs available, Suction available and Patient being monitored Patient Re-evaluated:Patient Re-evaluated prior to induction Oxygen Delivery Method: Circle system utilized Preoxygenation: Pre-oxygenation with 100% oxygen Induction Type: IV induction and Rapid sequence Laryngoscope Size: McGraph and 3 Grade View: Grade I Tube type: Oral Tube size: 7.0 mm Number of attempts: 1 Airway Equipment and Method: Stylet and Video-laryngoscopy Placement Confirmation: ETT inserted through vocal cords under direct vision,  positive ETCO2 and breath sounds checked- equal and bilateral Secured at: 21 cm Tube secured with: Tape Dental Injury: Teeth and Oropharynx as per pre-operative assessment

## 2019-07-05 NOTE — ED Notes (Addendum)
Vascular surgen at bedside

## 2019-07-06 LAB — BASIC METABOLIC PANEL
Anion gap: 6 (ref 5–15)
Anion gap: 8 (ref 5–15)
BUN: 14 mg/dL (ref 8–23)
BUN: 17 mg/dL (ref 8–23)
CO2: 27 mmol/L (ref 22–32)
CO2: 25 mmol/L (ref 22–32)
Calcium: 8.3 mg/dL — ABNORMAL LOW (ref 8.9–10.3)
Calcium: 7.9 mg/dL — ABNORMAL LOW (ref 8.9–10.3)
Chloride: 103 mmol/L (ref 98–111)
Chloride: 105 mmol/L (ref 98–111)
Creatinine, Ser: 0.78 mg/dL (ref 0.44–1.00)
Creatinine, Ser: 0.95 mg/dL (ref 0.44–1.00)
GFR calc Af Amer: >60 mL/min (ref >60)
GFR calc Af Amer: >60 mL/min (ref >60)
GFR calc non Af Amer: >60 mL/min (ref >60)
GFR calc non Af Amer: 54 mL/min — ABNORMAL LOW (ref >60)
Glucose, Bld: 152 mg/dL — ABNORMAL HIGH (ref 70–99)
Glucose, Bld: 89 mg/dL (ref 70–99)
Potassium: 3.9 mmol/L (ref 3.5–5.1)
Potassium: 3.8 mmol/L (ref 3.5–5.1)
Sodium: 136 mmol/L (ref 135–145)
Sodium: 138 mmol/L (ref 135–145)

## 2019-07-06 LAB — LACTIC ACID, PLASMA: Lactic Acid, Venous: 2.6 mmol/L (ref 0.5–1.9)

## 2019-07-06 LAB — PROCALCITONIN
Procalcitonin: <0.1 ng/mL
Procalcitonin: <0.1 ng/mL

## 2019-07-06 LAB — GLUCOSE, CAPILLARY
Glucose-Capillary: 134 mg/dL — ABNORMAL HIGH (ref 70–99)
Glucose-Capillary: 186 mg/dL — ABNORMAL HIGH (ref 70–99)
Glucose-Capillary: 78 mg/dL (ref 70–99)
Glucose-Capillary: 85 mg/dL (ref 70–99)
Glucose-Capillary: 94 mg/dL (ref 70–99)
Glucose-Capillary: 98 mg/dL (ref 70–99)

## 2019-07-06 LAB — CBC
HCT: 37.1 % (ref 36.0–46.0)
Hemoglobin: 12.1 g/dL (ref 12.0–15.0)
MCH: 29.8 pg (ref 26.0–34.0)
MCHC: 32.6 g/dL (ref 30.0–36.0)
MCV: 91.4 fL (ref 80.0–100.0)
Platelets: 356 10*3/uL (ref 150–400)
RBC: 4.06 MIL/uL (ref 3.87–5.11)
RDW: 13.2 % (ref 11.5–15.5)
WBC: 16.5 10*3/uL — ABNORMAL HIGH (ref 4.0–10.5)
nRBC: 0 % (ref 0.0–0.2)

## 2019-07-06 LAB — HEMOGLOBIN A1C
Hgb A1c MFr Bld: 6.3 % — ABNORMAL HIGH (ref 4.8–5.6)
Mean Plasma Glucose: 134.11 mg/dL

## 2019-07-06 MED ORDER — SODIUM CHLORIDE 0.9 % IV SOLN: 500.0000 mL | Freq: Once | INTRAVENOUS | Status: AC | PRN

## 2019-07-06 MED ORDER — LACTATED RINGERS IV BOLUS
1000.0000 mL | Freq: Once | INTRAVENOUS | Status: AC
  Administered 2019-07-06: 1000 mL via INTRAVENOUS

## 2019-07-06 MED ORDER — MIDAZOLAM HCL 2 MG/2ML IJ SOLN
2.0000 mg | INTRAMUSCULAR | Status: DC | PRN
  Administered 2019-07-06 – 2019-07-08 (×12): 2 mg via INTRAVENOUS
  Filled 2019-07-06 (×12): qty 2

## 2019-07-06 MED ORDER — IPRATROPIUM-ALBUTEROL 0.5-2.5 (3) MG/3ML IN SOLN
RESPIRATORY_TRACT | Status: AC
  Filled 2019-07-06: qty 3

## 2019-07-06 MED ORDER — SODIUM CHLORIDE 0.9 % IV BOLUS
500.0000 mL | Freq: Once | INTRAVENOUS | Status: AC
  Administered 2019-07-06: 500 mL via INTRAVENOUS

## 2019-07-06 MED ORDER — HEPARIN SODIUM (PORCINE) 5000 UNIT/ML IJ SOLN
5000.0000 [IU] | Freq: Three times a day (TID) | INTRAMUSCULAR | Status: DC
  Administered 2019-07-06 – 2019-07-08 (×6): 5000 [IU] via SUBCUTANEOUS
  Filled 2019-07-06 (×6): qty 1

## 2019-07-06 MED ORDER — SODIUM CHLORIDE 0.9 % IV SOLN
INTRAVENOUS | Status: DC
  Administered 2019-07-07: 150 mL/h via INTRAVENOUS

## 2019-07-06 MED ORDER — IPRATROPIUM-ALBUTEROL 0.5-2.5 (3) MG/3ML IN SOLN
3.0000 mL | Freq: Four times a day (QID) | RESPIRATORY_TRACT | Status: DC
  Administered 2019-07-06 – 2019-07-09 (×12): 3 mL via RESPIRATORY_TRACT
  Filled 2019-07-06 (×11): qty 3

## 2019-07-06 NOTE — Progress Notes (Signed)
[facility] Daily Progress Note   Subjective: 1 Day Post-Op: 1. US guidance for vascular access, bilateral femoral arteries 2. Catheter placement into aorta from bilateral femoral approaches and into the right renal artery from the left femoral approach 3. Placement of a 28 mm proximal 14 cm length Gore Excluder Endoprosthesis main body right with a 16 mm diameter by 12 cm length left contralateral limb 4. Placement of right iliac extension limb with a 14 mm diameter by 10 cm length limb 5. Placement of proximal aortic cuff x3 with 28 mm cuffs with the most proximal cuff being suprarenal 6. Right renal angiography 7. Placement of a right renal artery stent with a 6 mm diameter by 5 cm length Viabahn stent for a periscope maneuver combined with placing of a proximal aortic cuff proximal to the right renal artery origin 8. ProGlide closure devices bilateral femoral arteries  On vent and pressors this AM, stable overnight.   Objective: Vitals:   07/06/19 0800 07/06/19 0830 07/06/19 0900 07/06/19 0930  BP: (!) 95/53 (!) 107/46 (!) 108/44 106/88  Pulse: 76 (!) 55 (!) 53 62  Resp: 20 20 20 20   Temp: 98.6 F (37 C)     TempSrc: Axillary     SpO2: (!) 89% 98% 98% 94%  Weight:      Height:        Intake/Output Summary (Last 24 hours) at 07/06/2019 0953 Last data filed at 07/06/2019 [id number] Gross per 24 hour  Intake 5704.31 ml  Output 1775 ml  Net 3929.31 ml   Physical Exam: On vent, but easily arousable CV: RRR Pulmonary: Vent, CTA bilaterally Abdomen: Soft, Nontender, Mild distention Right Groin:  PAD in place. No drainage or swelling. Left Groin:   PAD in place. No drainage or swelling. Vascular:  Lower Extremity: Warm distally to toes. Mild edema.    Laboratory: CBC    Component Value Date/Time   WBC 16.5 (H) 07/06/2019 0349   HGB 12.1 07/06/2019 0349   HGB 14.5 12/01/2011 0517   HCT 37.1 07/06/2019 0349   HCT 44.2 12/01/2011 0517   PLT 356  07/06/2019 0349   PLT 283 12/01/2011 0517   BMET    Component Value Date/Time   NA 136 07/06/2019 0349   NA 139 05/07/2019 0852   NA 138 11/30/2011 1647   K 3.9 07/06/2019 0349   K 4.0 11/30/2011 1647   CL 103 07/06/2019 0349   CL 101 11/30/2011 1647   CO2 27 07/06/2019 0349   CO2 27 11/30/2011 1647   GLUCOSE 152 (H) 07/06/2019 0349   GLUCOSE 145 (H) 11/30/2011 1647   BUN 14 07/06/2019 0349   BUN 26 05/07/2019 0852   BUN 15 11/30/2011 1647   CREATININE 0.78 07/06/2019 0349   CREATININE 0.79 11/30/2011 1647   CALCIUM 8.3 (L) 07/06/2019 0349   CALCIUM 9.0 11/30/2011 1647   GFRNONAA >60 07/06/2019 0349   GFRNONAA >60 11/30/2011 1647   GFRAA >60 07/06/2019 0349   GFRAA >60 11/30/2011 1647   Assessment/Planning: The patient is an 86 year old female who presented to the [facility] emergency department with a ruptured AAA status post endovascular repair POD#1 1) vented, on 2 pressors this AM.  Relatively stable overnight. 2) making approximately 20cc/hr of urine - will increase fluids to 125cc/hr with an additional 500cc NS bolus now.  Please do not remove Foley.  AM BMP ordered. 3) H/H this AM - stable.  AM CBC orders. Will start DVT prophylaxis  heparin / SCDs / TED hose.  4) we will defer vent management ICU team. 5) okay to remove bilateral groin PAD.  Seen and examined with Tamera Stands PA-C 07/06/2019 9:53 AM

## 2019-07-06 NOTE — Progress Notes (Signed)
CRITICAL CARE PROGRESS NOTE    Name: Brittany Parks MRN: [id number] DOB: [birth year]     LOS: 1   SUBJECTIVE FINDINGS & SIGNIFICANT EVENTS   Patient description:  86 yo female admitted with ruptured infrarenal AAA s/p right renal artery angiography with placement of stent in the right renal artery and proximal aortic cuff with final cuff positioned in a suprarenal orientation remained mechanically intubated post    Lines / Drains: Fem TLC  Cultures / Sepsis markers: COVID19 negative MRSA pcr negative Infulenza negative Blood cx in process  Antibiotics: None    Protocols / Consultants: Vascular pccm hospitalist  Tests / Events: IVF challenge for fluid responsiveness  07/06/19-discussed case with daughter at bedside today  PAST MEDICAL HISTORY   Past Medical History:  Diagnosis Date  . Abdominal aortic aneurysm (AAA) ([place])   . Adrenal mass, left ([place])   . COPD (chronic obstructive pulmonary disease) ([place])   . COPD (chronic obstructive pulmonary disease) ([place])   . Emphysema lung ([place])   . Hyperlipidemia   . Hypertension   . Hypoxia      SURGICAL HISTORY   Past Surgical History:  Procedure Laterality Date  . BLADDER SURGERY    . BLADDER TUMOR EXCISION       FAMILY HISTORY   Family History  Problem Relation Age of Onset  . Alzheimer's disease Mother   . Heart attack Father   . Aortic aneurysm Sister   . Cancer Brother   . Hepatomegaly Brother   . Alcohol abuse Brother      SOCIAL HISTORY   Social History   Tobacco Use  . Smoking status: Former Smoker    Packs/day: 1.00    Years: 70.00    Pack years: 70.00    Types: Cigarettes    Quit date: 07/2017    Years since quitting: 1.9  . Smokeless tobacco: Never Used  . Tobacco comment: smoking cessation materials not  required  Substance Use Topics  . Alcohol use: No    Alcohol/week: 0.0 standard drinks  . Drug use: No     MEDICATIONS   Current Medication:  Current Facility-Administered Medications:  .  0.9 %  sodium chloride infusion, , Intravenous, Continuous, Stegmayer, Kimberly A, PA-C, Last Rate: 125 mL/hr at 07/06/19 0936, Rate Change at 07/06/19 0936 .  acetaminophen (TYLENOL) tablet 325-650 mg, 325-650 mg, Oral, Q4H PRN **OR** acetaminophen (TYLENOL) suppository 325-650 mg, 325-650 mg, Rectal, Q4H PRN, Schnier, Dolores Lory, MD .  chlorhexidine gluconate (MEDLINE KIT) (PERIDEX) 0.12 % solution 15 mL, 15 mL, Mouth Rinse, BID, Schnier, Dolores Lory, MD, 15 mL at 07/06/19 0802 .  Chlorhexidine Gluconate Cloth 2 % PADS 6 each, 6 each, Topical, Daily, Schnier, Dolores Lory, MD, 6 each at 07/23/2019 2200 .  docusate sodium (COLACE) capsule 100 mg, 100 mg, Oral, Daily, Schnier, Dolores Lory, MD .  DOPamine (INTROPIN) 800 mg in dextrose 5 % 250 mL (3.2 mg/mL) infusion, 3-5 mcg/kg/min, Intravenous, Continuous, Schnier, Dolores Lory, MD, Last Rate: 1.46 mL/hr at 07/06/19 0832, 1 mcg/kg/min at 07/06/19 0832 .  famotidine (PEPCID) IVPB 20 mg premix, 20 mg, Intravenous, Q12H, Schnier, Dolores Lory, MD, Last Rate: 100 mL/hr at 07/06/19 0932, 20 mg at 07/06/19 0932 .  fentaNYL (SUBLIMAZE) bolus via infusion 25 mcg, 25 mcg, Intravenous, Q15 min PRN, Awilda Bill, NP .  fentaNYL (SUBLIMAZE) injection 25 mcg, 25 mcg, Intravenous, Once, Blakeney, Dreama Saa, NP .  fentaNYL 2565mg in NS 2551m(1066mml) infusion-PREMIX, 25-200 mcg/hr, Intravenous, Continuous,  Awilda Bill, NP, Last Rate: 20 mL/hr at 07/06/19 0832, 200 mcg/hr at 07/06/19 0832 .  guaiFENesin-dextromethorphan (ROBITUSSIN DM) 100-10 MG/5ML syrup 15 mL, 15 mL, Oral, Q4H PRN, Schnier, Dolores Lory, MD .  heparin injection 5,000 Units, 5,000 Units, Subcutaneous, Q8H, Stegmayer, Kimberly A, PA-C .  hydrALAZINE (APRESOLINE) injection 5 mg, 5 mg, Intravenous, Q20 Min PRN,  Schnier, Dolores Lory, MD .  insulin aspart (novoLOG) injection 0-15 Units, 0-15 Units, Subcutaneous, Q4H, Awilda Bill, NP, 2 Units at 07/06/19 0404 .  labetalol (NORMODYNE) injection 10 mg, 10 mg, Intravenous, Q10 min PRN, Schnier, Dolores Lory, MD .  magnesium sulfate IVPB 2 g 50 mL, 2 g, Intravenous, Daily PRN, Schnier, Dolores Lory, MD .  MEDLINE mouth rinse, 15 mL, Mouth Rinse, 10 times per day, Schnier, Dolores Lory, MD, 15 mL at 07/06/19 0937 .  metoprolol tartrate (LOPRESSOR) injection 2-5 mg, 2-5 mg, Intravenous, Q2H PRN, Schnier, Dolores Lory, MD .  midazolam (VERSED) injection 1 mg, 1 mg, Intravenous, Q2H PRN, Awilda Bill, NP, 1 mg at 07/06/19 0925 .  morphine 2 MG/ML injection 2-5 mg, 2-5 mg, Intravenous, Q1H PRN, Schnier, Dolores Lory, MD .  nitroGLYCERIN 50 mg in dextrose 5 % 250 mL (0.2 mg/mL) infusion, 5-250 mcg/min, Intravenous, Titrated, Schnier, Dolores Lory, MD .  norepinephrine (LEVOPHED) 72m in 2512mpremix infusion, 0-40 mcg/min, Intravenous, Titrated, BlAwilda BillNP, Last Rate: 18.75 mL/hr at 07/06/19 0832, 5 mcg/min at 07/06/19 0832 .  ondansetron (ZOFRAN) injection 4 mg, 4 mg, Intravenous, Q6H PRN, Schnier, GrDolores LoryMD .  oxyCODONE-acetaminophen (PERCOCET/ROXICET) 5-325 MG per tablet 1-2 tablet, 1-2 tablet, Oral, Q4H PRN, Schnier, GrDolores LoryMD .  phenol (CHLORASEPTIC) mouth spray 1 spray, 1 spray, Mouth/Throat, PRN, Schnier, GrDolores LoryMD .  potassium chloride SA (KLOR-CON) CR tablet 20-40 mEq, 20-40 mEq, Oral, Daily PRN, ScDelana MeyerGrDolores LoryMD .  vancomycin (VANCOCIN) IVPB 1000 mg/200 mL premix, 1,000 mg, Intravenous, Q12H, Schnier, GrDolores LoryMD, Stopped at 07/06/19 0155    ALLERGIES   Other, Penicillins, Shellfish-derived products, Iodinated diagnostic agents, and Azithromycin    REVIEW OF SYSTEMS    Unable to obtain ROS due to mechanical ventilation  PHYSICAL EXAMINATION   Vital Signs: Temp:  [98.1 F (36.7 C)-98.6 F (37 C)] 98.6 F (37 C) (02/05  0800) Pulse Rate:  [45-89] 62 (02/05 0930) Resp:  [10-32] 20 (02/05 0930) BP: (74-207)/(37-113) 106/88 (02/05 0930) SpO2:  [87 %-100 %] 94 % (02/05 0930) Arterial Line BP: (64-175)/(32-61) 64/52 (02/05 0645) FiO2 (%):  [30 %-50 %] 50 % (02/05 0800) Weight:  [77.7 kg] 77.7 kg (02/04 1754)  GENERAL: Age-appropriate HEAD: Normocephalic, atraumatic.  EYES: Pupils equal, round, reactive to light.  No scleral icterus.  MOUTH: Moist mucosal membrane. NECK: Supple. No thyromegaly. No nodules. No JVD.  PULMONARY: Mild bibasilar crackles without rhonchorous breath sounds CARDIOVASCULAR: S1 and S2. Regular rate and rhythm. No murmurs, rubs, or gallops.  GASTROINTESTINAL: Soft, nontender, non-distended. No masses. Positive bowel sounds. No hepatosplenomegaly.  MUSCULOSKELETAL: No swelling, clubbing, or edema.  NEUROLOGIC: Intubated on mechanical ventilation with GCS 4 T SKIN:intact,warm,dry   PERTINENT DATA     Infusions: . sodium chloride 125 mL/hr at 07/06/19 0936  . DOPamine 1 mcg/kg/min (07/06/19 083267 . famotidine (PEPCID) IV 20 mg (07/06/19 0932)  . fentaNYL infusion INTRAVENOUS 200 mcg/hr (07/06/19 0832)  . magnesium sulfate bolus IVPB    . nitroGLYCERIN    . norepinephrine (LEVOPHED) Adult infusion 5 mcg/min (07/06/19 0832)  . vancomycin  Stopped (07/06/19 0155)   Scheduled Medications: . chlorhexidine gluconate (MEDLINE KIT)  15 mL Mouth Rinse BID  . Chlorhexidine Gluconate Cloth  6 each Topical Daily  . docusate sodium  100 mg Oral Daily  . fentaNYL (SUBLIMAZE) injection  25 mcg Intravenous Once  . heparin  5,000 Units Subcutaneous Q8H  . insulin aspart  0-15 Units Subcutaneous Q4H  . mouth rinse  15 mL Mouth Rinse 10 times per day   PRN Medications: acetaminophen **OR** acetaminophen, fentaNYL, guaiFENesin-dextromethorphan, hydrALAZINE, labetalol, magnesium sulfate bolus IVPB, metoprolol tartrate, midazolam, morphine injection, ondansetron, oxyCODONE-acetaminophen,  phenol, potassium chloride Hemodynamic parameters:   Intake/Output: 02/04 0701 - 02/05 0700 In: 5085.4 [I.V.:2803.3; IV Piggyback:2282.1] Out: 1749 [Urine:1450; Blood:200]  Ventilator  Settings: Vent Mode: PRVC FiO2 (%):  [30 %-50 %] 50 % Set Rate:  [14 bmp-20 bmp] 20 bmp Vt Set:  [500 mL] 500 mL PEEP:  [5 cmH20] 5 cmH20 Plateau Pressure:  [17 cmH20-20 cmH20] 20 cmH20     LAB RESULTS:  Basic Metabolic Panel: Recent Labs  Lab 07/13/2019 1735 07/12/2019 1735 07/03/2019 2204 07/06/19 0349  NA 134*  --  135 136  K 4.0   < > 4.2 3.9  CL 98  --  99 103  CO2 26  --  25 27  GLUCOSE 214*  --  208* 152*  BUN 17  --  15 14  CREATININE 0.67  --  0.73 0.78  CALCIUM 8.9  --  8.6* 8.3*  MG  --   --  1.6*  --   PHOS  --   --  4.0  --    < > = values in this interval not displayed.   Liver Function Tests: Recent Labs  Lab 07/26/2019 1735  AST 13*  ALT 10  ALKPHOS 70  BILITOT 0.8  PROT 6.6  ALBUMIN 3.1*   No results for input(s): LIPASE, AMYLASE in the last 168 hours. No results for input(s): AMMONIA in the last 168 hours. CBC: Recent Labs  Lab 07/27/2019 1735 07/23/2019 2204 07/06/19 0349  WBC 14.0* 16.6* 16.5*  NEUTROABS 12.3* 15.5*  --   HGB 13.7 12.9 12.1  HCT 42.8 40.5 37.1  MCV 92.0 92.0 91.4  PLT 360 284 356   Cardiac Enzymes: No results for input(s): CKTOTAL, CKMB, CKMBINDEX, TROPONINI in the last 168 hours. BNP: Invalid input(s): POCBNP CBG: Recent Labs  Lab 07/21/2019 2133 07/06/19 0055 07/06/19 0357 07/06/19 0734  GLUCAP 185* 186* 134* 93     IMAGING RESULTS:  Imaging: CT Angio Chest/Abd/Pel for Dissection W and/or Wo Contrast  Result Date: 07/12/2019 CLINICAL DATA:  86 year old female with abdominal aortic aneurysm. Operative planning. EXAM: CT ANGIOGRAPHY CHEST, ABDOMEN AND PELVIS TECHNIQUE: Multidetector CT imaging through the chest, abdomen and pelvis was performed using the standard protocol during bolus administration of intravenous contrast.  Multiplanar reconstructed images and MIPs were obtained and reviewed to evaluate the vascular anatomy. CONTRAST:  59m OMNIPAQUE IOHEXOL 350 MG/ML SOLN COMPARISON:  CT abdomen pelvis dated 03/06/2004. Chest CT dated 11/30/2011. FINDINGS: CTA CHEST FINDINGS Cardiovascular: There is no cardiomegaly or pericardial effusion. There is coronary vascular calcification of the RCA. There is advanced atherosclerotic calcification of the thoracic aorta. No aneurysmal dilatation or dissection. The origins of the great vessels of the aortic arch appear patent as visualized. There is mild dilatation of the central pulmonary arteries suggestive of a degree of pulmonary hypertension. Evaluation of the pulmonary vessels is limited due to suboptimal opacification and timing of the contrast. Mediastinum/Nodes: There  is no hilar or mediastinal adenopathy. There is a small hiatal hernia. The esophagus and the thyroid gland are grossly unremarkable. No mediastinal fluid collection. Lungs/Pleura: There is moderate centrilobular emphysema. No focal consolidation, pleural effusion, or pneumothorax. There is a 5 mm right lower lobe nodule (series 7, image 96). The central airways are patent. Musculoskeletal: Degenerative changes of the spine. No acute osseous pathology. Review of the MIP images confirms the above findings. CTA ABDOMEN AND PELVIS FINDINGS VASCULAR Aorta: There is advanced atherosclerotic calcification of the abdominal aorta. There is a partially thrombosed infrarenal abdominal aortic aneurysm. The aneurysm starts just below the takeoff of the renal arteries and extends through the entire length of the infrarenal aorta just above the aortic bifurcation. It measures approximately 11 cm in craniocaudal length (sagittal series 10, image 109). The maximal diameter of the aneurysm including the thrombosed lumen is approximately 5.7 cm (sagittal series 10, image 109). Several faint foci of high attenuation within the thrombosed  portion of the aneurysm are concerning for active intramural bleed (series 6, images 1 20-122). The upper portion of the infrarenal aortic aneurysm measures approximately 3.8 cm in diameter (sagittal series 10, image 110). There is extraluminal high attenuating fluid adjacent to the distal aortic aneurysm consistent with blood and in keeping with aneurysm rupture. No definite extraluminal contrast identified. Celiac: There is atherosclerotic calcification of the origin of the celiac axis. The celiac artery may is patent. The left gastric artery appears to origin it directly from the aorta superior to the takeoff of the celiac artery (series 6, image 77). SMA: Patent without evidence of aneurysm, dissection, vasculitis or significant stenosis. Renals: The renal arteries are patent. There is a small accessory renal artery on the right arising from the aorta just above the main renal artery and extending to the upper pole of the right kidney. There is a single dominant artery on the left. IMA: The origin of the IMA is not visualized with certainty. Inflow: Advanced atherosclerotic calcification of the iliac arteries. The iliac arteries remain patent. The right common iliac artery measures 14 mm and the left common iliac artery measures 15 mm in diameter. Veins: No obvious venous abnormality within the limitations of this arterial phase study. Review of the MIP images confirms the above findings. NON-VASCULAR No intraabdominal free air. Hepatobiliary: The liver is unremarkable. No intrahepatic biliary ductal dilatation. There are multiple gallstones. No pericholecystic fluid or evidence of acute cholecystitis by CT. Pancreas: Unremarkable. No pancreatic ductal dilatation or surrounding inflammatory changes. Spleen: Normal in size without focal abnormality. Adrenals/Urinary Tract: Bilateral adrenal adenoma measuring up to 17 mm on the left. There is no hydronephrosis or nephrolithiasis on either side. Several small right  renal cysts. The visualized ureters appear unremarkable. The urinary bladder appears slightly lobulated. Stomach/Bowel: There are several sigmoid diverticula without active inflammatory changes. There is no bowel obstruction or active inflammation. Diffusely thickened appearance of the colon likely related to underdistention. The appendix is not visualized with certainty. No inflammatory changes identified in the right lower quadrant. Lymphatic: No adenopathy. Reproductive: Small calcified uterine fibroid.  No adnexal masses. Other: None Musculoskeletal: Degenerative changes of the spine. No acute osseous pathology. Grade 1 L4-L5 anterolisthesis and multilevel lower lumbar facet arthropathy. Review of the MIP images confirms the above findings. IMPRESSION: 1. Infrarenal abdominal aortic aneurysm as described with evidence of rupture. Clinical correlation and vascular surgery consult is advised. 2. Emphysema and advanced atherosclerotic calcification of the aorta. Aortic Atherosclerosis (ICD10-I70.0) and Emphysema (ICD10-J43.9). 3. Cholelithiasis.  4. Sigmoid diverticulosis. These results were called by telephone at the time of interpretation on 07/06/2019 at 6:15 pm to provider Merlyn Lot , who verbally acknowledged these results. Electronically Signed   By: Anner Crete M.D.   On: 07/29/2019 18:20       ASSESSMENT AND PLAN    -Multidisciplinary rounds held today  Circulatory shock  -Due to ruptured infrarenal abdominal aortic aneurysm  -Vascular surgery is on case-appreciate input status post surgery -continue Full MV support -continue Bronchodilator Therapy -Wean Fio2 and PEEP as tolerated -will perform SAT/SBT when respiratory parameters are met   Ruptured infrarenal AAA  -Vascular surgery on case -status post aortic cuff placement  -Discussed case with vascular surgery today, patient remains with suboptimal urine output will continue with IV fluid resuscitation and continue to wean  vasopressor support  -Repeat chest x-ray in a.m. ICU telemetry monitoring  ID -continue IV abx as prescibed -follow up cultures  GI/Nutrition GI PROPHYLAXIS as indicated DIET-->TF's as tolerated Constipation protocol as indicated  ENDO - ICU hypoglycemic\Hyperglycemia protocol -check FSBS per protocol   ELECTROLYTES -follow labs as needed -replace as needed -pharmacy consultation   DVT/GI PRX ordered -SCDs  TRANSFUSIONS AS NEEDED MONITOR FSBS ASSESS the need for LABS as needed   Critical care provider statement:    Critical care time (minutes):  33   Critical care time was exclusive of:  Separately billable procedures and treating other patients   Critical care was necessary to treat or prevent imminent or life-threatening deterioration of the following conditions:   Circulatory shock, abdominal aortic aneurysm rupture, multiple comorbid conditions   Critical care was time spent personally by me on the following activities:  Development of treatment plan with patient or surrogate, discussions with consultants, evaluation of patient's response to treatment, examination of patient, obtaining history from patient or surrogate, ordering and performing treatments and interventions, ordering and review of laboratory studies and re-evaluation of patient's condition.  I assumed direction of critical care for this patient from another provider in my specialty: no    This document was prepared using Dragon voice recognition software and may include unintentional dictation errors.    Ottie Glazier, M.D.  Division of [facility]

## 2019-07-06 NOTE — ED Notes (Signed)
Dr. Quentin Cornwall gave verbal order to administer a second 0.5 mg dose of dilaudid at this time. 0.5mg  of dilaudid administered to pt at 1740 out of initial vial used to administer first dose at 1710.

## 2019-07-06 NOTE — Anesthesia Postprocedure Evaluation (Signed)
Anesthesia Post Note  Patient: Brittany Parks  Procedure(s) Performed: ABDOMINAL AORTIC ENDOVASCULAR STENT GRAFT with right renal stent placement, right femoral vein central line placement (N/A )  Patient location during evaluation: SICU Anesthesia Type: General Level of consciousness: sedated Pain management: pain level controlled Vital Signs Assessment: post-procedure vital signs reviewed and stable Respiratory status: patient remains intubated per anesthesia plan Cardiovascular status: stable Postop Assessment: no apparent nausea or vomiting Anesthetic complications: no     Last Vitals:  Vitals:   07/06/19 0345 07/06/19 0400  BP:  (!) 100/51  Pulse: 75 75  Resp: 20 20  Temp:  36.8 C  SpO2: 98% 97%    Last Pain:  Vitals:   07/06/19 0400  TempSrc: Axillary  PainSc:                  Caryl Asp

## 2019-07-06 NOTE — Progress Notes (Signed)
Initial Nutrition Assessment  RD working remotely.  DOCUMENTATION CODES:   Not applicable  INTERVENTION:  If patient remains intubated >24-48 hours recommend initiating tube feeds once appropriate per CCM and Vascular Surgery. Patient would need placement of enteral access.  If tube feeds are initiated provide Vital AF 1.2 Cal at 55 mL/hr (1320 mL goal daily volume). Provides 1584 kcal, 99 grams of protein, 1069 mL H2O daily.  If tube feeds are initiated provide minimum free water flush of 30 mL Q4hrs.   NUTRITION DIAGNOSIS:   Inadequate oral intake related to inability to eat as evidenced by NPO status.  GOAL:   Provide needs based on ASPEN/SCCM guidelines  MONITOR:   Vent status, Labs, Weight trends, Skin, I & O's  REASON FOR ASSESSMENT:   Ventilator    ASSESSMENT:   86 year old female with PMHx of COPD, HLD, HTN, AAA admitted with ruptured AAA now s/p endovascular repair on 2/4 and remains in ICU on ventilator.   Patient is currently intubated on ventilator support MV: 10 L/min Temp (24hrs), Avg:98.4 F (36.9 C), Min:98.1 F (36.7 C), Max:98.6 F (37 C)  Propofol: N/A  Medications reviewed and include: Colace 100 mg daily, Novolog 0-15 units Q4hrs, NS at 150 mL/hr, dopamine gtt now off, famotidine, fentanyl gtt, norepinephrine gtt at 5 mcg/min.  Labs reviewed: CBG 78-186.  Enteral Access: none documented  NUTRITION - FOCUSED PHYSICAL EXAM:  Unable to complete at this time.  Diet Order:   Diet Order            Diet NPO time specified  Diet effective now             EDUCATION NEEDS:   No education needs have been identified at this time  Skin:  Skin Assessment: Reviewed RN Assessment  Last BM:  Unknown/PTA  Height:   Ht Readings from Last 1 Encounters:  07/09/2019 5\' 2"  (1.575 m)   Weight:   Wt Readings from Last 1 Encounters:  07/13/2019 77.7 kg   Ideal Body Weight:  50 kg  BMI:  Body mass index is 31.33 kg/m.  Estimated Nutritional  Needs:   Kcal:  1535  Protein:  95-115 grams  Fluid:  1.8 L/day  Jacklynn Barnacle, MS, RD, LDN Pager number available on [employer]

## 2019-07-07 ENCOUNTER — Inpatient Hospital Stay: Payer: Medicare Other

## 2019-07-07 ENCOUNTER — Inpatient Hospital Stay (HOSPITAL_COMMUNITY)
Admit: 2019-07-07 | Discharge: 2019-07-07 | Disposition: A | Discharge status: Home / Self Care | Payer: Medicare Other | Attending: Pulmonary Disease | Admitting: Pulmonary Disease

## 2019-07-07 DIAGNOSIS — I5031 Acute diastolic (congestive) heart failure: Secondary | ICD-10-CM

## 2019-07-07 LAB — BASIC METABOLIC PANEL
Anion gap: 8 (ref 5–15)
BUN: 17 mg/dL (ref 8–23)
CO2: 22 mmol/L (ref 22–32)
Calcium: 8 mg/dL — ABNORMAL LOW (ref 8.9–10.3)
Chloride: 107 mmol/L (ref 98–111)
Creatinine, Ser: 0.97 mg/dL (ref 0.44–1.00)
GFR calc Af Amer: >60 mL/min (ref >60)
GFR calc non Af Amer: 53 mL/min — ABNORMAL LOW (ref >60)
Glucose, Bld: 95 mg/dL (ref 70–99)
Potassium: 4.1 mmol/L (ref 3.5–5.1)
Sodium: 137 mmol/L (ref 135–145)

## 2019-07-07 LAB — GLUCOSE, CAPILLARY
Glucose-Capillary: 69 mg/dL — ABNORMAL LOW (ref 70–99)
Glucose-Capillary: 76 mg/dL (ref 70–99)
Glucose-Capillary: 76 mg/dL (ref 70–99)
Glucose-Capillary: 88 mg/dL (ref 70–99)
Glucose-Capillary: 89 mg/dL (ref 70–99)
Glucose-Capillary: 91 mg/dL (ref 70–99)
Glucose-Capillary: 92 mg/dL (ref 70–99)

## 2019-07-07 LAB — CBC
HCT: 34.1 % — ABNORMAL LOW (ref 36.0–46.0)
Hemoglobin: 10.7 g/dL — ABNORMAL LOW (ref 12.0–15.0)
MCH: 29.7 pg (ref 26.0–34.0)
MCHC: 31.4 g/dL (ref 30.0–36.0)
MCV: 94.7 fL (ref 80.0–100.0)
Platelets: 262 10*3/uL (ref 150–400)
RBC: 3.6 MIL/uL — ABNORMAL LOW (ref 3.87–5.11)
RDW: 13.8 % (ref 11.5–15.5)
WBC: 11.1 10*3/uL — ABNORMAL HIGH (ref 4.0–10.5)
nRBC: 0 % (ref 0.0–0.2)

## 2019-07-07 LAB — ECHOCARDIOGRAM COMPLETE
Height: 62.008 in
Weight: 2740.76 oz

## 2019-07-07 LAB — PROCALCITONIN: Procalcitonin: <0.1 ng/mL

## 2019-07-07 LAB — MAGNESIUM: Magnesium: 2.1 mg/dL (ref 1.7–2.4)

## 2019-07-07 MED ORDER — DEXTROSE 5 % IV SOLN: INTRAVENOUS | Status: DC

## 2019-07-07 MED ORDER — DEXTROSE 50 % IV SOLN
INTRAVENOUS | Status: AC
  Administered 2019-07-07: 25 mL via INTRAVENOUS
  Filled 2019-07-07: qty 50

## 2019-07-07 MED ORDER — DEXTROSE 50 % IV SOLN: 25.0000 mL | Freq: Once | INTRAVENOUS | Status: AC

## 2019-07-07 NOTE — Progress Notes (Signed)
CRITICAL CARE PROGRESS NOTE    Name: Brittany Parks MRN: [id number] DOB: 06-Jan-1931     LOS: 2   SUBJECTIVE FINDINGS & SIGNIFICANT EVENTS   Patient description:  86 yo female admitted with ruptured infrarenal AAA s/p right renal artery angiography with placement of stent in the right renal artery and proximal aortic cuff with final cuff positioned in a suprarenal orientation remained mechanically intubated post    Lines / Drains: Fem TLC  Cultures / Sepsis markers: COVID19 negative MRSA pcr negative Infulenza negative Blood cx in process  Antibiotics: None    Protocols / Consultants: Vascular pccm hospitalist  Tests / Events: IVF challenge for fluid responsiveness  07/06/19-discussed case with daughter at bedside today 07/07/19 -attempting spontaneous breathing trial today to liberate patient from mechanical ventilation.  Care plan discussed with vascular surgery today.  Care plan discussed with daughter at bedside today.  PAST MEDICAL HISTORY   Past Medical History:  Diagnosis Date  . Abdominal aortic aneurysm (AAA) ([place])   . Adrenal mass, left ([place])   . COPD (chronic obstructive pulmonary disease) ([place])   . COPD (chronic obstructive pulmonary disease) ([place])   . Emphysema lung ([place])   . Hyperlipidemia   . Hypertension   . Hypoxia      SURGICAL HISTORY   Past Surgical History:  Procedure Laterality Date  . ABDOMINAL AORTIC ENDOVASCULAR STENT GRAFT N/A 07/04/2019   Procedure: ABDOMINAL AORTIC ENDOVASCULAR STENT GRAFT with right renal stent placement, right femoral vein central line placement;  Surgeon: Katha Cabal, MD;  Location: [facility] ORS;  Service: Vascular;  Laterality: N/A;  . BLADDER SURGERY    . BLADDER TUMOR EXCISION       FAMILY HISTORY   Family History  Problem  Relation Age of Onset  . Alzheimer's disease Mother   . Heart attack Father   . Aortic aneurysm Sister   . Cancer Brother   . Hepatomegaly Brother   . Alcohol abuse Brother      SOCIAL HISTORY   Social History   Tobacco Use  . Smoking status: Former Smoker    Packs/day: 1.00    Years: 70.00    Pack years: 70.00    Types: Cigarettes    Quit date: 07/2017    Years since quitting: 1.9  . Smokeless tobacco: Never Used  . Tobacco comment: smoking cessation materials not required  Substance Use Topics  . Alcohol use: No    Alcohol/week: 0.0 standard drinks  . Drug use: No     MEDICATIONS   Current Medication:  Current Facility-Administered Medications:  .  0.9 %  sodium chloride infusion, , Intravenous, Continuous, Stegmayer, Kimberly A, PA-C, Last Rate: 150 mL/hr at 07/07/19 1529, Rate Verify at 07/07/19 1529 .  acetaminophen (TYLENOL) tablet 325-650 mg, 325-650 mg, Oral, Q4H PRN **OR** acetaminophen (TYLENOL) suppository 325-650 mg, 325-650 mg, Rectal, Q4H PRN, Schnier, Dolores Lory, MD .  chlorhexidine gluconate (MEDLINE KIT) (PERIDEX) 0.12 % solution 15 mL, 15 mL, Mouth Rinse, BID, Schnier, Dolores Lory, MD, 15 mL at 07/07/19 0741 .  Chlorhexidine Gluconate Cloth 2 % PADS 6 each, 6 each, Topical, Daily, Schnier, Dolores Lory, MD, 6 each at 07/07/19 [phone] .  docusate sodium (COLACE) capsule 100 mg, 100 mg, Oral, Daily, Schnier, Dolores Lory, MD .  DOPamine (INTROPIN) 800 mg in dextrose 5 % 250 mL (3.2 mg/mL) infusion, 3-5 mcg/kg/min, Intravenous, Continuous, Schnier, Dolores Lory, MD, Stopped at 07/06/19 1121 .  famotidine (PEPCID) IVPB 20 mg premix, 20 mg,  Intravenous, Q12H, Schnier, Dolores Lory, MD, Stopped at 07/07/19 [phone] .  fentaNYL (SUBLIMAZE) bolus via infusion 25 mcg, 25 mcg, Intravenous, Q15 min PRN, Awilda Bill, NP, 25 mcg at 07/07/19 1520 .  fentaNYL (SUBLIMAZE) injection 25 mcg, 25 mcg, Intravenous, Once, Blakeney, Dana G, NP .  fentaNYL 2565mg in NS 2551m(1037mml)  infusion-PREMIX, 0-400 mcg/hr, Intravenous, Continuous, KeeDarel Hong NP, Last Rate: 22.5 mL/hr at 07/07/19 1529, 225 mcg/hr at 07/07/19 1529 .  guaiFENesin-dextromethorphan (ROBITUSSIN DM) 100-10 MG/5ML syrup 15 mL, 15 mL, Oral, Q4H PRN, Schnier, GreDolores LoryD .  heparin injection 5,000 Units, 5,000 Units, Subcutaneous, Q8H, Stegmayer, Kimberly A, PA-C, 5,000 Units at 07/07/19 1345 .  hydrALAZINE (APRESOLINE) injection 5 mg, 5 mg, Intravenous, Q20 Min PRN, Schnier, GreDolores LoryD .  insulin aspart (novoLOG) injection 0-15 Units, 0-15 Units, Subcutaneous, Q4H, BlaAwilda BillP, 2 Units at 07/06/19 0404 .  ipratropium-albuterol (DUONEB) 0.5-2.5 (3) MG/3ML nebulizer solution 3 mL, 3 mL, Nebulization, Q6H, Keysi Oelkers, MD, 3 mL at 07/07/19 1419 .  labetalol (NORMODYNE) injection 10 mg, 10 mg, Intravenous, Q10 min PRN, Schnier, GreDolores LoryD .  magnesium sulfate IVPB 2 g 50 mL, 2 g, Intravenous, Daily PRN, Schnier, GreDolores LoryD .  MEDLINE mouth rinse, 15 mL, Mouth Rinse, 10 times per day, Schnier, GreDolores LoryD, 15 mL at 07/07/19 1529 .  metoprolol tartrate (LOPRESSOR) injection 2-5 mg, 2-5 mg, Intravenous, Q2H PRN, Schnier, GreDolores LoryD .  midazolam (VERSED) injection 1 mg, 1 mg, Intravenous, Q2H PRN, BlaAwilda BillP, 1 mg at 07/07/19 1350 .  midazolam (VERSED) injection 2 mg, 2 mg, Intravenous, Q10 min PRN, AleOttie GlazierD, 2 mg at 07/07/19 1527 .  morphine 2 MG/ML injection 2-5 mg, 2-5 mg, Intravenous, Q1H PRN, Schnier, GreDolores LoryD, 2 mg at 07/06/19 1016 .  nitroGLYCERIN 50 mg in dextrose 5 % 250 mL (0.2 mg/mL) infusion, 5-250 mcg/min, Intravenous, Titrated, Schnier, GreDolores LoryD .  norepinephrine (LEVOPHED) 4mg45m 250mL8mmix infusion, 0-40 mcg/min, Intravenous, Titrated, BlakeAwilda Bill Last Rate: 7.5 mL/hr at 07/07/19 1529, 2 mcg/min at 07/07/19 1529 .  ondansetron (ZOFRAN) injection 4 mg, 4 mg, Intravenous, Q6H PRN, Schnier, GregoDolores Lory.  oxyCODONE-acetaminophen  (PERCOCET/ROXICET) 5-325 MG per tablet 1-2 tablet, 1-2 tablet, Oral, Q4H PRN, Schnier, GregoDolores Lory.  phenol (CHLORASEPTIC) mouth spray 1 spray, 1 spray, Mouth/Throat, PRN, Schnier, GregoDolores Lory.  potassium chloride SA (KLOR-CON) CR tablet 20-40 mEq, 20-40 mEq, Oral, Daily PRN, Schnier, GregoDolores Lory   ALLERGIES   Other, Penicillins, Shellfish-derived products, Iodinated diagnostic agents, and Azithromycin    REVIEW OF SYSTEMS    Unable to obtain ROS due to mechanical ventilation  PHYSICAL EXAMINATION   Vital Signs: Temp:  [97.7 F (36.5 C)-100.5 F (38.1 C)] 97.7 F (36.5 C) (02/06 1200) Pulse Rate:  [58-76] 76 (02/06 1500) Resp:  [9-20] 9 (02/05 1700) BP: (92-146)/(35-83) 116/42 (02/06 1500) SpO2:  [93 %-100 %] 95 % (02/06 1500) FiO2 (%):  [35 %] 35 % (02/06 1419)  GENERAL: Age-appropriate HEAD: Normocephalic, atraumatic.  EYES: Pupils equal, round, reactive to light.  No scleral icterus.  MOUTH: Moist mucosal membrane. NECK: Supple. No thyromegaly. No nodules. No JVD.  PULMONARY: Mild bibasilar crackles without rhonchorous breath sounds CARDIOVASCULAR: S1 and S2. Regular rate and rhythm. No murmurs, rubs, or gallops.  GASTROINTESTINAL: Soft, nontender, non-distended. No masses. Positive bowel sounds. No hepatosplenomegaly.  MUSCULOSKELETAL: No swelling, clubbing, or edema.  NEUROLOGIC: Intubated on mechanical ventilation with GCS 4 T SKIN:intact,warm,dry   PERTINENT DATA     Infusions: . sodium chloride 150 mL/hr at 07/07/19 1529  . DOPamine Stopped (07/06/19 1121)  . famotidine (PEPCID) IV Stopped (07/07/19 9373)  . fentaNYL infusion INTRAVENOUS 225 mcg/hr (07/07/19 1529)  . magnesium sulfate bolus IVPB    . nitroGLYCERIN    . norepinephrine (LEVOPHED) Adult infusion 2 mcg/min (07/07/19 1529)   Scheduled Medications: . chlorhexidine gluconate (MEDLINE KIT)  15 mL Mouth Rinse BID  . Chlorhexidine Gluconate Cloth  6 each Topical Daily  . docusate  sodium  100 mg Oral Daily  . fentaNYL (SUBLIMAZE) injection  25 mcg Intravenous Once  . heparin  5,000 Units Subcutaneous Q8H  . insulin aspart  0-15 Units Subcutaneous Q4H  . ipratropium-albuterol  3 mL Nebulization Q6H  . mouth rinse  15 mL Mouth Rinse 10 times per day   PRN Medications: acetaminophen **OR** acetaminophen, fentaNYL, guaiFENesin-dextromethorphan, hydrALAZINE, labetalol, magnesium sulfate bolus IVPB, metoprolol tartrate, midazolam, midazolam, morphine injection, ondansetron, oxyCODONE-acetaminophen, phenol, potassium chloride Hemodynamic parameters:   Intake/Output: 02/05 0701 - 02/06 0700 In: 6110.2 [I.V.:6010.2; IV Piggyback:100] Out: 425 [Urine:425]  Ventilator  Settings: Vent Mode: PRVC FiO2 (%):  [35 %] 35 % Set Rate:  [20 bmp] 20 bmp Vt Set:  [500 mL] 500 mL PEEP:  [5 cmH20] 5 cmH20     LAB RESULTS:  Basic Metabolic Panel: Recent Labs  Lab 07/04/2019 1735 07/28/2019 1735 07/17/2019 2204 07/12/2019 2204 07/06/19 0349 07/06/19 0349 07/06/19 1615 07/07/19 0449  NA 134*  --  135  --  136  --  138 137  K 4.0   < > 4.2   < > 3.9   < > 3.8 4.1  CL 98  --  99  --  103  --  105 107  CO2 26  --  25  --  27  --  25 22  GLUCOSE 214*  --  208*  --  152*  --  89 95  BUN 17  --  15  --  14  --  17 17  CREATININE 0.67  --  0.73  --  0.78  --  0.95 0.97  CALCIUM 8.9  --  8.6*  --  8.3*  --  7.9* 8.0*  MG  --   --  1.6*  --   --   --   --  2.1  PHOS  --   --  4.0  --   --   --   --   --    < > = values in this interval not displayed.   Liver Function Tests: Recent Labs  Lab 07/04/2019 1735  AST 13*  ALT 10  ALKPHOS 70  BILITOT 0.8  PROT 6.6  ALBUMIN 3.1*   No results for input(s): LIPASE, AMYLASE in the last 168 hours. No results for input(s): AMMONIA in the last 168 hours. CBC: Recent Labs  Lab 07/15/2019 1735 07/29/2019 2204 07/06/19 0349 07/07/19 0449  WBC 14.0* 16.6* 16.5* 11.1*  NEUTROABS 12.3* 15.5*  --   --   HGB 13.7 12.9 12.1 10.7*  HCT 42.8  40.5 37.1 34.1*  MCV 92.0 92.0 91.4 94.7  PLT 360 284 356 262   Cardiac Enzymes: No results for input(s): CKTOTAL, CKMB, CKMBINDEX, TROPONINI in the last 168 hours. BNP: Invalid input(s): POCBNP CBG: Recent Labs  Lab 07/06/19 2015 07/07/19 0014 07/07/19 0344 07/07/19 0746 07/07/19 1140  GLUCAP 94 91 88 89  19     IMAGING RESULTS:  Imaging: PERIPHERAL VASCULAR CATHETERIZATION  Result Date: 07/06/2019 See op note  DG Chest Port 1 View  Result Date: 07/07/2019 CLINICAL DATA:  Acute respiratory failure EXAM: PORTABLE CHEST 1 VIEW COMPARISON:  11/30/2011 FINDINGS: An endotracheal tube is identified with tip 6 cm above the carina. UPPER limits normal heart size with pulmonary vascular congestion noted. Mild interstitial opacities are suggestive of mild interstitial edema. No pleural effusion or pneumothorax. No acute bony abnormalities identified. IMPRESSION: 1. Endotracheal tube with tip 6 cm above the carina. 2. Pulmonary vascular congestion and probable mild interstitial edema. Electronically Signed   By: Margarette Canada M.D.   On: 07/07/2019 10:27   ECHOCARDIOGRAM COMPLETE  Result Date: 07/07/2019   ECHOCARDIOGRAM REPORT   Patient Name:   Brittany Parks Date of Exam: 07/07/2019 Medical Rec #:  [id number]          Height:       62.0 in Accession #:    [id number]         Weight:       171.3 lb Date of Birth:  1930/10/20           BSA:          1.79 m Patient Age:    89 years           BP:           118/48 mmHg Patient Gender: F                  HR:           68 bpm. Exam Location:  [facility] Procedure: 2D Echo Indications:     CHF 428.31/ I50.31  History:         Patient has no prior history of Echocardiogram examinations.  Sonographer:     Arville Go RDCS Referring Phys:  [id number] Ottie Glazier Diagnosing Phys: Buford Dresser MD  Sonographer Comments: Technically difficult study due to poor echo windows. Ventilator. IMPRESSIONS  1. Left ventricular ejection fraction, by visual  estimation, is 60 to 65%. The left ventricle has normal function. There is moderately increased left ventricular hypertrophy.  2. Elevated left ventricular end-diastolic pressure.  3. Left ventricular diastolic parameters are indeterminate.  4. The left ventricle has no regional wall motion abnormalities.  5. Global right ventricle has normal systolic function.The right ventricular size is normal. No increase in right ventricular wall thickness.  6. Left atrial size was normal.  7. Right atrial size was normal.  8. Severe mitral annular calcification.  9. The mitral valve is normal in structure. Trivial mitral valve regurgitation. 10. The tricuspid valve is normal in structure. 11. The tricuspid valve is normal in structure. Tricuspid valve regurgitation is trivial. 12. The aortic valve is tricuspid. Aortic valve regurgitation is not visualized. Mild aortic valve stenosis. 13. The pulmonic valve was not well visualized. Pulmonic valve regurgitation is not visualized. 14. TR signal is inadequate for assessing pulmonary artery systolic pressure. 15. The inferior vena cava is normal in size with <50% respiratory variability, suggesting right atrial pressure of 8 mmHg. 16. No prior Echocardiogram. FINDINGS  Left Ventricle: Left ventricular ejection fraction, by visual estimation, is 60 to 65%. The left ventricle has normal function. The left ventricle has no regional wall motion abnormalities. There is moderately increased left ventricular hypertrophy. Concentric left ventricular hypertrophy. Left ventricular diastolic parameters are indeterminate. Elevated left ventricular end-diastolic pressure. Right Ventricle: The right ventricular size is normal. No  increase in right ventricular wall thickness. Global RV systolic function is has normal systolic function. Left Atrium: Left atrial size was normal in size. Right Atrium: Right atrial size was normal in size Pericardium: There is no evidence of pericardial effusion.  Mitral Valve: The mitral valve is normal in structure. There is mild thickening of the mitral valve leaflet(s). There is mild calcification of the mitral valve leaflet(s). Severe mitral annular calcification. Trivial mitral valve regurgitation. Tricuspid Valve: The tricuspid valve is normal in structure. Tricuspid valve regurgitation is trivial. Aortic Valve: The aortic valve is tricuspid. . There is mild thickening and moderate calcification of the aortic valve. Aortic valve regurgitation is not visualized. Mild aortic stenosis is present. There is mild thickening of the aortic valve. There is moderate calcification of the aortic valve. Aortic valve mean gradient measures 8.0 mmHg. Aortic valve peak gradient measures 14.6 mmHg. Aortic valve area, by VTI measures 2.35 cm. Pulmonic Valve: The pulmonic valve was not well visualized. Pulmonic valve regurgitation is not visualized. Pulmonic regurgitation is not visualized. Aorta: The aortic root and ascending aorta are structurally normal, with no evidence of dilitation. Pulmonary Artery: The pulmonary artery is not well seen. Venous: The inferior vena cava is normal in size with less than 50% respiratory variability, suggesting right atrial pressure of 8 mmHg. IAS/Shunts: No atrial level shunt detected by color flow Doppler.  LEFT VENTRICLE PLAX 2D LVIDd:         4.92 cm  Diastology LVIDs:         3.18 cm  LV e' lateral:   4.57 cm/s LV PW:         1.41 cm  LV E/e' lateral: 24.7 LV IVS:        1.32 cm  LV e' medial:    3.05 cm/s LVOT diam:     1.90 cm  LV E/e' medial:  37.0 LV SV:         74 ml LV SV Index:   39.26 LVOT Area:     2.84 cm  RIGHT VENTRICLE RV Basal diam:  2.41 cm RV S prime:     16.20 cm/s TAPSE (M-mode): 2.3 cm LEFT ATRIUM           Index       RIGHT ATRIUM          Index LA diam:      4.30 cm 2.40 cm/m  RA Area:     7.19 cm LA Vol (A2C): 20.3 ml 11.34 ml/m RA Volume:   8.94 ml  4.99 ml/m LA Vol (A4C): 35.5 ml 19.83 ml/m  AORTIC VALVE                     PULMONIC VALVE AV Area (Vmax):    2.39 cm     PV Vmax:       1.19 m/s AV Area (Vmean):   2.33 cm     PV Peak grad:  5.7 mmHg AV Area (VTI):     2.35 cm AV Vmax:           191.00 cm/s AV Vmean:          134.000 cm/s AV VTI:            0.361 m AV Peak Grad:      14.6 mmHg AV Mean Grad:      8.0 mmHg LVOT Vmax:         161.00 cm/s LVOT Vmean:  110.000 cm/s LVOT VTI:          0.299 m LVOT/AV VTI ratio: 0.83  AORTA Ao Root diam: 2.50 cm MITRAL VALVE MV Area (PHT): 2.29 cm              SHUNTS MV PHT:        95.99 msec            Systemic VTI:  0.30 m MV Decel Time: 331 msec              Systemic Diam: 1.90 cm MV E velocity: 113.00 cm/s 103 cm/s MV A velocity: 157.00 cm/s 70.3 cm/s MV E/A ratio:  0.72        1.5  Buford Dresser MD Electronically signed by Buford Dresser MD Signature Date/Time: 07/07/2019/2:58:25 PM    Final    CT Angio Chest/Abd/Pel for Dissection W and/or Wo Contrast  Result Date: 07/29/2019 CLINICAL DATA:  86 year old female with abdominal aortic aneurysm. Operative planning. EXAM: CT ANGIOGRAPHY CHEST, ABDOMEN AND PELVIS TECHNIQUE: Multidetector CT imaging through the chest, abdomen and pelvis was performed using the standard protocol during bolus administration of intravenous contrast. Multiplanar reconstructed images and MIPs were obtained and reviewed to evaluate the vascular anatomy. CONTRAST:  55m OMNIPAQUE IOHEXOL 350 MG/ML SOLN COMPARISON:  CT abdomen pelvis dated 03/06/2004. Chest CT dated 11/30/2011. FINDINGS: CTA CHEST FINDINGS Cardiovascular: There is no cardiomegaly or pericardial effusion. There is coronary vascular calcification of the RCA. There is advanced atherosclerotic calcification of the thoracic aorta. No aneurysmal dilatation or dissection. The origins of the great vessels of the aortic arch appear patent as visualized. There is mild dilatation of the central pulmonary arteries suggestive of a degree of pulmonary hypertension. Evaluation of the  pulmonary vessels is limited due to suboptimal opacification and timing of the contrast. Mediastinum/Nodes: There is no hilar or mediastinal adenopathy. There is a small hiatal hernia. The esophagus and the thyroid gland are grossly unremarkable. No mediastinal fluid collection. Lungs/Pleura: There is moderate centrilobular emphysema. No focal consolidation, pleural effusion, or pneumothorax. There is a 5 mm right lower lobe nodule (series 7, image 96). The central airways are patent. Musculoskeletal: Degenerative changes of the spine. No acute osseous pathology. Review of the MIP images confirms the above findings. CTA ABDOMEN AND PELVIS FINDINGS VASCULAR Aorta: There is advanced atherosclerotic calcification of the abdominal aorta. There is a partially thrombosed infrarenal abdominal aortic aneurysm. The aneurysm starts just below the takeoff of the renal arteries and extends through the entire length of the infrarenal aorta just above the aortic bifurcation. It measures approximately 11 cm in craniocaudal length (sagittal series 10, image 109). The maximal diameter of the aneurysm including the thrombosed lumen is approximately 5.7 cm (sagittal series 10, image 109). Several faint foci of high attenuation within the thrombosed portion of the aneurysm are concerning for active intramural bleed (series 6, images 1 20-122). The upper portion of the infrarenal aortic aneurysm measures approximately 3.8 cm in diameter (sagittal series 10, image 110). There is extraluminal high attenuating fluid adjacent to the distal aortic aneurysm consistent with blood and in keeping with aneurysm rupture. No definite extraluminal contrast identified. Celiac: There is atherosclerotic calcification of the origin of the celiac axis. The celiac artery may is patent. The left gastric artery appears to origin it directly from the aorta superior to the takeoff of the celiac artery (series 6, image 77). SMA: Patent without evidence of  aneurysm, dissection, vasculitis or significant stenosis. Renals: The renal arteries are patent.  There is a small accessory renal artery on the right arising from the aorta just above the main renal artery and extending to the upper pole of the right kidney. There is a single dominant artery on the left. IMA: The origin of the IMA is not visualized with certainty. Inflow: Advanced atherosclerotic calcification of the iliac arteries. The iliac arteries remain patent. The right common iliac artery measures 14 mm and the left common iliac artery measures 15 mm in diameter. Veins: No obvious venous abnormality within the limitations of this arterial phase study. Review of the MIP images confirms the above findings. NON-VASCULAR No intraabdominal free air. Hepatobiliary: The liver is unremarkable. No intrahepatic biliary ductal dilatation. There are multiple gallstones. No pericholecystic fluid or evidence of acute cholecystitis by CT. Pancreas: Unremarkable. No pancreatic ductal dilatation or surrounding inflammatory changes. Spleen: Normal in size without focal abnormality. Adrenals/Urinary Tract: Bilateral adrenal adenoma measuring up to 17 mm on the left. There is no hydronephrosis or nephrolithiasis on either side. Several small right renal cysts. The visualized ureters appear unremarkable. The urinary bladder appears slightly lobulated. Stomach/Bowel: There are several sigmoid diverticula without active inflammatory changes. There is no bowel obstruction or active inflammation. Diffusely thickened appearance of the colon likely related to underdistention. The appendix is not visualized with certainty. No inflammatory changes identified in the right lower quadrant. Lymphatic: No adenopathy. Reproductive: Small calcified uterine fibroid.  No adnexal masses. Other: None Musculoskeletal: Degenerative changes of the spine. No acute osseous pathology. Grade 1 L4-L5 anterolisthesis and multilevel lower lumbar facet  arthropathy. Review of the MIP images confirms the above findings. IMPRESSION: 1. Infrarenal abdominal aortic aneurysm as described with evidence of rupture. Clinical correlation and vascular surgery consult is advised. 2. Emphysema and advanced atherosclerotic calcification of the aorta. Aortic Atherosclerosis (ICD10-I70.0) and Emphysema (ICD10-J43.9). 3. Cholelithiasis. 4. Sigmoid diverticulosis. These results were called by telephone at the time of interpretation on 07/04/2019 at 6:15 pm to provider Merlyn Lot , who verbally acknowledged these results. Electronically Signed   By: Anner Crete M.D.   On: 07/22/2019 18:20       ASSESSMENT AND PLAN    -Multidisciplinary rounds held today  Circulatory shock  -Due to ruptured infrarenal abdominal aortic aneurysm  -Vascular surgery is on case-appreciate input status post surgery -continue Full MV support -continue Bronchodilator Therapy -Wean Fio2 and PEEP as tolerated -will perform SAT/SBT when respiratory parameters are met   Ruptured infrarenal AAA  -Vascular surgery on case -status post aortic cuff placement  -Discussed case with vascular surgery today, patient remains with suboptimal urine output will continue with IV fluid resuscitation and continue to wean vasopressor support  -Repeat chest x-ray 07/07/19 - Mild pulm edema. ICU telemetry monitoring  ID -continue IV abx as prescibed -follow up cultures  GI/Nutrition GI PROPHYLAXIS as indicated DIET-->TF's as tolerated Constipation protocol as indicated  ENDO - ICU hypoglycemic\Hyperglycemia protocol -check FSBS per protocol   ELECTROLYTES -follow labs as needed -replace as needed -pharmacy consultation   DVT/GI PRX ordered -SCDs  TRANSFUSIONS AS NEEDED MONITOR FSBS ASSESS the need for LABS as needed   Critical care provider statement:    Critical care time (minutes):  33   Critical care time was exclusive of:  Separately billable procedures and treating  other patients   Critical care was necessary to treat or prevent imminent or life-threatening deterioration of the following conditions:   Circulatory shock, abdominal aortic aneurysm rupture, multiple comorbid conditions   Critical care was time spent personally by  me on the following activities:  Development of treatment plan with patient or surrogate, discussions with consultants, evaluation of patient's response to treatment, examination of patient, obtaining history from patient or surrogate, ordering and performing treatments and interventions, ordering and review of laboratory studies and re-evaluation of patient's condition.  I assumed direction of critical care for this patient from another provider in my specialty: no    This document was prepared using Dragon voice recognition software and may include unintentional dictation errors.    Ottie Glazier, M.D.  Division of [place]

## 2019-07-07 NOTE — Progress Notes (Signed)
*  PRELIMINARY RESULTS* Echocardiogram 2D Echocardiogram has been performed.  [place] 07/07/2019, 8:55 AM

## 2019-07-07 NOTE — Progress Notes (Signed)
    Subjective  - POD #2, s/p EVAR for ruptured AAA  Opens eyes to voice   Physical Exam:  Remains intubated Follows simple commands Groins soft without hematoma Feet well perfused       Assessment/Plan:  POD #2  Renal:  Creatinine remains stable within normal range.  UOP improved with volume CV:  Wean pressor support Pulm:  Possible extubation today, appreciate CCM assistance ID:  No active issues Prophylaxis:  SQ heparin, pepcid GI:  Remains NPO, can slowly start PO if extubated Acute blood loss anemia:  Hb remains stable Consi  Brittany Parks 07/07/2019 1:30 PM --  Vitals:   07/07/19 1100 07/07/19 1200  BP: (!) 125/43 (!) 146/52  Pulse: 61 76  Resp:    Temp:  97.7 F (36.5 C)  SpO2: 100% 100%    Intake/Output Summary (Last 24 hours) at 07/07/2019 1330 Last data filed at 07/07/2019 1215 Gross per 24 hour  Intake 4775.17 ml  Output 415 ml  Net 4360.17 ml     Laboratory CBC    Component Value Date/Time   WBC 11.1 (H) 07/07/2019 0449   HGB 10.7 (L) 07/07/2019 0449   HGB 14.5 12/01/2011 0517   HCT 34.1 (L) 07/07/2019 0449   HCT 44.2 12/01/2011 0517   PLT 262 07/07/2019 0449   PLT 283 12/01/2011 0517    BMET    Component Value Date/Time   NA 137 07/07/2019 0449   NA 139 05/07/2019 0852   NA 138 11/30/2011 1647   K 4.1 07/07/2019 0449   K 4.0 11/30/2011 1647   CL 107 07/07/2019 0449   CL 101 11/30/2011 1647   CO2 22 07/07/2019 0449   CO2 27 11/30/2011 1647   GLUCOSE 95 07/07/2019 0449   GLUCOSE 145 (H) 11/30/2011 1647   BUN 17 07/07/2019 0449   BUN 26 05/07/2019 0852   BUN 15 11/30/2011 1647   CREATININE 0.97 07/07/2019 0449   CREATININE 0.79 11/30/2011 1647   CALCIUM 8.0 (L) 07/07/2019 0449   CALCIUM 9.0 11/30/2011 1647   GFRNONAA 53 (L) 07/07/2019 0449   GFRNONAA >60 11/30/2011 1647   GFRAA >60 07/07/2019 0449   GFRAA >60 11/30/2011 1647    COAG No results found for: INR, PROTIME No results found for:  PTT  Antibiotics Anti-infectives (From admission, onward)   Start     Dose/Rate Route Frequency Ordered Stop   07/27/2019 2300  vancomycin (VANCOCIN) IVPB 1000 mg/200 mL premix     1,000 mg 200 mL/hr over 60 Minutes Intravenous Every 12 hours 07/17/2019 2212 07/06/19 1900       V. Leia Alf, M.D., [facility] [place] Office: [phone] Pager:  [phone]

## 2019-07-08 ENCOUNTER — Inpatient Hospital Stay: Payer: Medicare Other

## 2019-07-08 LAB — GLUCOSE, CAPILLARY
Glucose-Capillary: 104 mg/dL — ABNORMAL HIGH (ref 70–99)
Glucose-Capillary: 112 mg/dL — ABNORMAL HIGH (ref 70–99)
Glucose-Capillary: 112 mg/dL — ABNORMAL HIGH (ref 70–99)
Glucose-Capillary: 123 mg/dL — ABNORMAL HIGH (ref 70–99)
Glucose-Capillary: 131 mg/dL — ABNORMAL HIGH (ref 70–99)
Glucose-Capillary: 82 mg/dL (ref 70–99)
Glucose-Capillary: 98 mg/dL (ref 70–99)

## 2019-07-08 LAB — BASIC METABOLIC PANEL
Anion gap: 6 (ref 5–15)
BUN: 15 mg/dL (ref 8–23)
CO2: 21 mmol/L — ABNORMAL LOW (ref 22–32)
Calcium: 8.2 mg/dL — ABNORMAL LOW (ref 8.9–10.3)
Chloride: 111 mmol/L (ref 98–111)
Creatinine, Ser: 0.71 mg/dL (ref 0.44–1.00)
GFR calc Af Amer: >60 mL/min (ref >60)
GFR calc non Af Amer: >60 mL/min (ref >60)
Glucose, Bld: 96 mg/dL (ref 70–99)
Potassium: 3.9 mmol/L (ref 3.5–5.1)
Sodium: 138 mmol/L (ref 135–145)

## 2019-07-08 LAB — BLOOD GAS, ARTERIAL
Acid-base deficit: 2 mmol/L (ref 0.0–2.0)
Bicarbonate: 23.7 mmol/L (ref 20.0–28.0)
Delivery systems: POSITIVE
Expiratory PAP: 6
FIO2: 0.4
Inspiratory PAP: 18
O2 Saturation: 92.9 %
Patient temperature: 37
RATE: 12 resp/min
pCO2 arterial: 43 mmHg (ref 32.0–48.0)
pH, Arterial: 7.35 (ref 7.350–7.450)
pO2, Arterial: 70 mmHg — ABNORMAL LOW (ref 83.0–108.0)

## 2019-07-08 LAB — CBC
HCT: 32.9 % — ABNORMAL LOW (ref 36.0–46.0)
Hemoglobin: 10.2 g/dL — ABNORMAL LOW (ref 12.0–15.0)
MCH: 29.7 pg (ref 26.0–34.0)
MCHC: 31 g/dL (ref 30.0–36.0)
MCV: 95.9 fL (ref 80.0–100.0)
Platelets: 223 10*3/uL (ref 150–400)
RBC: 3.43 MIL/uL — ABNORMAL LOW (ref 3.87–5.11)
RDW: 13.9 % (ref 11.5–15.5)
WBC: 9.2 10*3/uL (ref 4.0–10.5)
nRBC: 0 % (ref 0.0–0.2)

## 2019-07-08 LAB — PROCALCITONIN: Procalcitonin: <0.1 ng/mL

## 2019-07-08 MED ORDER — SODIUM CHLORIDE 0.9% FLUSH
10.0000 mL | Freq: Two times a day (BID) | INTRAVENOUS | Status: DC
  Administered 2019-07-08 (×3): 10 mL

## 2019-07-08 MED ORDER — SODIUM CHLORIDE 0.9% FLUSH: 10.0000 mL | INTRAVENOUS | Status: DC | PRN

## 2019-07-08 MED ORDER — MIDAZOLAM HCL 2 MG/2ML IJ SOLN
INTRAMUSCULAR | Status: AC
  Filled 2019-07-08: qty 2

## 2019-07-08 MED ORDER — FUROSEMIDE 10 MG/ML IJ SOLN
40.0000 mg | Freq: Every day | INTRAMUSCULAR | Status: DC
  Administered 2019-07-08 – 2019-07-09 (×2): 40 mg via INTRAVENOUS
  Filled 2019-07-08 (×2): qty 4

## 2019-07-08 NOTE — Progress Notes (Signed)
Pt. Extubated to bipap.

## 2019-07-08 NOTE — Progress Notes (Signed)
    Subjective  - POD #3, s/p EVAR for ruptured AAA  Awake on vent   Physical Exam:  Follow simple commands Remains intubated Bilateral groins without complication Extremities well perfused Abdomen soft nontender  Assessment/Plan:  POD #3  CV: Currently off pressor medication Pulm: Intubated with plans for possible extubation ID: No active issues Renal: Creatinine remains within the normal range.  Urine output has picked up with volume resuscitation  Acute blood loss anemia: Hemoglobin remains stable.  No indications for transfusion. Prophylaxis: Subcu heparin and Pepcid  Dr. Lanney Gins and I had a discussion with the patient's daughter and son regarding plans for extubation.  The family has reviewed her living will.  We discussed that there is a window of opportunity for extubation today.  The concern is what to do if she requires reintubation.  The family is in full agreement that if she requires re-intubation that we would proceed.  Brittany Parks 07/08/2019 2:13 PM --  Vitals:   07/08/19 1200 07/08/19 1300  BP: (!) 82/56 (!) 191/100  Pulse: (!) 107 98  Resp:    Temp: 99.1 F (37.3 C)   SpO2: 93% 93%    Intake/Output Summary (Last 24 hours) at 07/08/2019 1413 Last data filed at 07/08/2019 1200 Gross per 24 hour  Intake 4383.86 ml  Output 600 ml  Net 3783.86 ml     Laboratory CBC    Component Value Date/Time   WBC 9.2 07/08/2019 0455   HGB 10.2 (L) 07/08/2019 0455   HGB 14.5 12/01/2011 0517   HCT 32.9 (L) 07/08/2019 0455   HCT 44.2 12/01/2011 0517   PLT 223 07/08/2019 0455   PLT 283 12/01/2011 0517    BMET    Component Value Date/Time   NA 138 07/08/2019 0455   NA 139 05/07/2019 0852   NA 138 11/30/2011 1647   K 3.9 07/08/2019 0455   K 4.0 11/30/2011 1647   CL 111 07/08/2019 0455   CL 101 11/30/2011 1647   CO2 21 (L) 07/08/2019 0455   CO2 27 11/30/2011 1647   GLUCOSE 96 07/08/2019 0455   GLUCOSE 145 (H) 11/30/2011 1647   BUN 15 07/08/2019 0455   BUN 26 05/07/2019 0852   BUN 15 11/30/2011 1647   CREATININE 0.71 07/08/2019 0455   CREATININE 0.79 11/30/2011 1647   CALCIUM 8.2 (L) 07/08/2019 0455   CALCIUM 9.0 11/30/2011 1647   GFRNONAA >60 07/08/2019 0455   GFRNONAA >60 11/30/2011 1647   GFRAA >60 07/08/2019 0455   GFRAA >60 11/30/2011 1647    COAG No results found for: INR, PROTIME No results found for: PTT  Antibiotics Anti-infectives (From admission, onward)   Start     Dose/Rate Route Frequency Ordered Stop   07/06/2019 2300  vancomycin (VANCOCIN) IVPB 1000 mg/200 mL premix     1,000 mg 200 mL/hr over 60 Minutes Intravenous Every 12 hours 07/16/2019 2212 07/06/19 1900       V. Leia Alf, M.D., [facility] [place] Office: [phone] Pager:  [phone]

## 2019-07-08 NOTE — Progress Notes (Signed)
CRITICAL CARE PROGRESS NOTE    Name: Brittany Parks MRN: [id number] DOB: 1930/07/21     LOS: 3   SUBJECTIVE FINDINGS & SIGNIFICANT EVENTS   Patient description:  86 yo female admitted with ruptured infrarenal AAA s/p right renal artery angiography with placement of stent in the right renal artery and proximal aortic cuff with final cuff positioned in a suprarenal orientation remained mechanically intubated post    Lines / Drains: Fem TLC  Cultures / Sepsis markers: COVID19 negative MRSA pcr negative Infulenza negative Blood cx in process  Antibiotics: None    Protocols / Consultants: Vascular pccm hospitalist  Tests / Events: IVF challenge for fluid responsiveness  07/06/19-discussed case with daughter at bedside today 07/07/19 -attempting spontaneous breathing trial today to liberate patient from mechanical ventilation.  Care plan discussed with vascular surgery today.  Care plan discussed with daughter at bedside today. 07/08/19- Family conference - present is POA Peggy who is designated visito , son Artis Delay, and daughter Costella Hatcher -We discussed that patient is with advanced age actively smoking emphysema patient who survived AAA repair and is at high risk of re-intubation they discussed among themselves and wish to re-intubate if necessary.   PAST MEDICAL HISTORY   Past Medical History:  Diagnosis Date   Abdominal aortic aneurysm (AAA) (HCC)    Adrenal mass, left (HCC)    COPD (chronic obstructive pulmonary disease) (HCC)    COPD (chronic obstructive pulmonary disease) (HCC)    Emphysema lung (HCC)    Hyperlipidemia    Hypertension    Hypoxia      SURGICAL HISTORY   Past Surgical History:  Procedure Laterality Date   ABDOMINAL AORTIC ENDOVASCULAR STENT GRAFT N/A  07/11/2019   Procedure: ABDOMINAL AORTIC ENDOVASCULAR STENT GRAFT with right renal stent placement, right femoral vein central line placement;  Surgeon: Katha Cabal, MD;  Location: [facility] ORS;  Service: Vascular;  Laterality: N/A;   BLADDER SURGERY     BLADDER TUMOR EXCISION       FAMILY HISTORY   Family History  Problem Relation Age of Onset   Alzheimer's disease Mother    Heart attack Father    Aortic aneurysm Sister    Cancer Brother    Hepatomegaly Brother    Alcohol abuse Brother      SOCIAL HISTORY   Social History   Tobacco Use   Smoking status: Former Smoker    Packs/day: 1.00    Years: 70.00    Pack years: 70.00    Types: Cigarettes    Quit date: 07/2017    Years since quitting: 1.9   Smokeless tobacco: Never Used   Tobacco comment: smoking cessation materials not required  Substance Use Topics   Alcohol use: No    Alcohol/week: 0.0 standard drinks   Drug use: No     MEDICATIONS   Current Medication:  Current Facility-Administered Medications:    0.9 %  sodium chloride infusion, , Intravenous, Continuous, Stegmayer, Kimberly A, PA-C, Last Rate: 150 mL/hr at 07/08/19 0500, Rate Verify at 07/08/19 0500   acetaminophen (TYLENOL) tablet 325-650 mg, 325-650 mg, Oral, Q4H PRN **OR** acetaminophen (TYLENOL) suppository 325-650 mg, 325-650 mg, Rectal, Q4H PRN, Schnier, Dolores Lory, MD   chlorhexidine gluconate (MEDLINE KIT) (PERIDEX) 0.12 % solution 15 mL, 15 mL, Mouth Rinse, BID, Schnier, Dolores Lory, MD, 15 mL at 07/08/19 0757   Chlorhexidine Gluconate Cloth 2 % PADS 6 each, 6 each, Topical, Daily, Schnier, Dolores Lory, MD, 6 each at 07/07/19  9470   dextrose 5 % solution, , Intravenous, Continuous, Darel Hong D, NP, Last Rate: 50 mL/hr at 07/08/19 0500, Rate Verify at 07/08/19 0500   docusate sodium (COLACE) capsule 100 mg, 100 mg, Oral, Daily, Schnier, Dolores Lory, MD   famotidine (PEPCID) IVPB 20 mg premix, 20 mg, Intravenous, Q12H,  Schnier, Dolores Lory, MD, Last Rate: 100 mL/hr at 07/08/19 1233, 20 mg at 07/08/19 1233   furosemide (LASIX) injection 40 mg, 40 mg, Intravenous, Daily, Ottie Glazier, MD, 40 mg at 07/08/19 1232   hydrALAZINE (APRESOLINE) injection 5 mg, 5 mg, Intravenous, Q20 Min PRN, Schnier, Dolores Lory, MD   insulin aspart (novoLOG) injection 0-15 Units, 0-15 Units, Subcutaneous, Q4H, Awilda Bill, NP, 2 Units at 07/06/19 0404   ipratropium-albuterol (DUONEB) 0.5-2.5 (3) MG/3ML nebulizer solution 3 mL, 3 mL, Nebulization, Q6H, Lanney Gins, Nicollette Wilhelmi, MD, 3 mL at 07/08/19 0745   labetalol (NORMODYNE) injection 10 mg, 10 mg, Intravenous, Q10 min PRN, Schnier, Dolores Lory, MD   magnesium sulfate IVPB 2 g 50 mL, 2 g, Intravenous, Daily PRN, Schnier, Dolores Lory, MD   MEDLINE mouth rinse, 15 mL, Mouth Rinse, 10 times per day, Schnier, Dolores Lory, MD, 15 mL at 07/08/19 1237   metoprolol tartrate (LOPRESSOR) injection 2-5 mg, 2-5 mg, Intravenous, Q2H PRN, Schnier, Dolores Lory, MD   nitroGLYCERIN 50 mg in dextrose 5 % 250 mL (0.2 mg/mL) infusion, 5-250 mcg/min, Intravenous, Titrated, Schnier, Dolores Lory, MD   phenol (CHLORASEPTIC) mouth spray 1 spray, 1 spray, Mouth/Throat, PRN, Schnier, Dolores Lory, MD   potassium chloride SA (KLOR-CON) CR tablet 20-40 mEq, 20-40 mEq, Oral, Daily PRN, Schnier, Dolores Lory, MD   sodium chloride flush (NS) 0.9 % injection 10-40 mL, 10-40 mL, Intracatheter, Q12H, Darel Hong D, NP, 10 mL at 07/08/19 0430   sodium chloride flush (NS) 0.9 % injection 10-40 mL, 10-40 mL, Intracatheter, PRN, [name], NP    ALLERGIES   Other, Penicillins, Shellfish-derived products, Iodinated diagnostic agents, and Azithromycin    REVIEW OF SYSTEMS    Unable to obtain ROS due to mechanical ventilation  PHYSICAL EXAMINATION   Vital Signs: Temp:  [99.1 F (37.3 C)-100.1 F (37.8 C)] 99.1 F (37.3 C) (02/07 0800) Pulse Rate:  [57-76] 59 (02/07 0500) BP: (97-130)/(35-63) 110/41 (02/07  0500) SpO2:  [89 %-99 %] 93 % (02/07 0945) FiO2 (%):  [30 %-35 %] 30 % (02/07 0945)  GENERAL: Age-appropriate HEAD: Normocephalic, atraumatic.  EYES: Pupils equal, round, reactive to light.  No scleral icterus.  MOUTH: Moist mucosal membrane. NECK: Supple. No thyromegaly. No nodules. No JVD.  PULMONARY: Mild bibasilar crackles without rhonchorous breath sounds CARDIOVASCULAR: S1 and S2. Regular rate and rhythm. No murmurs, rubs, or gallops.  GASTROINTESTINAL: Soft, nontender, non-distended. No masses. Positive bowel sounds. No hepatosplenomegaly.  MUSCULOSKELETAL: No swelling, clubbing, or edema.  NEUROLOGIC: Intubated on mechanical ventilation with GCS 4 T SKIN:intact,warm,dry   PERTINENT DATA     Infusions:  sodium chloride 150 mL/hr at 07/08/19 0500   dextrose 50 mL/hr at 07/08/19 0500   famotidine (PEPCID) IV 20 mg (07/08/19 1233)   magnesium sulfate bolus IVPB     nitroGLYCERIN     Scheduled Medications:  chlorhexidine gluconate (MEDLINE KIT)  15 mL Mouth Rinse BID   Chlorhexidine Gluconate Cloth  6 each Topical Daily   docusate sodium  100 mg Oral Daily   furosemide  40 mg Intravenous Daily   insulin aspart  0-15 Units Subcutaneous Q4H   ipratropium-albuterol  3 mL Nebulization Q6H  mouth rinse  15 mL Mouth Rinse 10 times per day   sodium chloride flush  10-40 mL Intracatheter Q12H   PRN Medications: acetaminophen **OR** acetaminophen, hydrALAZINE, labetalol, magnesium sulfate bolus IVPB, metoprolol tartrate, phenol, potassium chloride, sodium chloride flush Hemodynamic parameters:   Intake/Output: 02/06 0701 - 02/07 0700 In: 0349 [I.V.:4902; IV Piggyback:90] Out: 550 [Urine:550]  Ventilator  Settings: Vent Mode: PSV FiO2 (%):  [30 %-35 %] 30 % Set Rate:  [20 bmp] 20 bmp Vt Set:  [500 mL] 500 mL PEEP:  [5 cmH20] 5 cmH20 Pressure Support:  [5 cmH20] 5 cmH20     LAB RESULTS:  Basic Metabolic Panel: Recent Labs  Lab 07/28/2019 2204  07/23/2019 2204 07/06/19 0349 07/06/19 0349 07/06/19 1615 07/06/19 1615 07/07/19 0449 07/08/19 0455  NA 135  --  136  --  138  --  137 138  K 4.2   < > 3.9   < > 3.8   < > 4.1 3.9  CL 99  --  103  --  105  --  107 111  CO2 25  --  27  --  25  --  22 21*  GLUCOSE 208*  --  152*  --  89  --  95 96  BUN 15  --  14  --  17  --  17 15  CREATININE 0.73  --  0.78  --  0.95  --  0.97 0.71  CALCIUM 8.6*  --  8.3*  --  7.9*  --  8.0* 8.2*  MG 1.6*  --   --   --   --   --  2.1  --   PHOS 4.0  --   --   --   --   --   --   --    < > = values in this interval not displayed.   Liver Function Tests: Recent Labs  Lab 07/25/2019 1735  AST 13*  ALT 10  ALKPHOS 70  BILITOT 0.8  PROT 6.6  ALBUMIN 3.1*   No results for input(s): LIPASE, AMYLASE in the last 168 hours. No results for input(s): AMMONIA in the last 168 hours. CBC: Recent Labs  Lab 07/13/2019 1735 07/26/2019 2204 07/06/19 0349 07/07/19 0449 07/08/19 0455  WBC 14.0* 16.6* 16.5* 11.1* 9.2  NEUTROABS 12.3* 15.5*  --   --   --   HGB 13.7 12.9 12.1 10.7* 10.2*  HCT 42.8 40.5 37.1 34.1* 32.9*  MCV 92.0 92.0 91.4 94.7 95.9  PLT 360 284 356 262 223   Cardiac Enzymes: No results for input(s): CKTOTAL, CKMB, CKMBINDEX, TROPONINI in the last 168 hours. BNP: Invalid input(s): POCBNP CBG: Recent Labs  Lab 07/07/19 2334 07/08/19 0338 07/08/19 0728 07/08/19 0749 07/08/19 1231  GLUCAP 69* 104* 98 82 112*     IMAGING RESULTS:  Imaging: DG Chest Port 1 View  Result Date: 07/08/2019 CLINICAL DATA:  Intubation. EXAM: PORTABLE CHEST 1 VIEW COMPARISON:  July 08, 2019. FINDINGS: Endotracheal tube is in grossly good position. Stable cardiomegaly. Atherosclerosis of thoracic aorta is noted. Mild central pulmonary vascular congestion is noted with probable bilateral pulmonary edema. Small pleural effusions are noted. No pneumothorax is noted. Bony thorax is unremarkable. IMPRESSION: Endotracheal tube in grossly good position. Mild central  pulmonary vascular congestion is noted with probable bilateral pulmonary edema. Aortic Atherosclerosis (ICD10-I70.0). Electronically Signed   By: Marijo Conception M.D.   On: 07/08/2019 10:58   DG Chest Port 1 View  Result Date: 07/08/2019  CLINICAL DATA:  Ventilator support EXAM: PORTABLE CHEST 1 VIEW COMPARISON:  07/07/2019 FINDINGS: Endotracheal tube tip is 6 cm above the carina. Mild cardiomegaly. Chronic aortic atherosclerosis. Mild interstitial edema persists, similar to the previous study. Volume loss may be slightly worsened in the lower lobes. IMPRESSION: Persistent mild pulmonary edema. Possible slight worsening of volume loss in the lower lobes. Electronically Signed   By: Nelson Chimes M.D.   On: 07/08/2019 00:57   DG Chest Port 1 View  Result Date: 07/07/2019 CLINICAL DATA:  Acute respiratory failure EXAM: PORTABLE CHEST 1 VIEW COMPARISON:  11/30/2011 FINDINGS: An endotracheal tube is identified with tip 6 cm above the carina. UPPER limits normal heart size with pulmonary vascular congestion noted. Mild interstitial opacities are suggestive of mild interstitial edema. No pleural effusion or pneumothorax. No acute bony abnormalities identified. IMPRESSION: 1. Endotracheal tube with tip 6 cm above the carina. 2. Pulmonary vascular congestion and probable mild interstitial edema. Electronically Signed   By: Margarette Canada M.D.   On: 07/07/2019 10:27   ECHOCARDIOGRAM COMPLETE  Result Date: 07/07/2019   ECHOCARDIOGRAM REPORT   Patient Name:   Brittany Parks Date of Exam: 07/07/2019 Medical Rec #:  [id number]          Height:       62.0 in Accession #:    [id number]         Weight:       171.3 lb Date of Birth:  15-Mar-1931           BSA:          1.79 m Patient Age:    52 years           BP:           118/48 mmHg Patient Gender: F                  HR:           68 bpm. Exam Location:  [facility] Procedure: 2D Echo Indications:     CHF 428.31/ I50.31  History:         Patient has no prior history of Echocardiogram  examinations.  Sonographer:     Arville Go RDCS Referring Phys:  [id number] Ottie Glazier Diagnosing Phys: Buford Dresser MD  Sonographer Comments: Technically difficult study due to poor echo windows. Ventilator. IMPRESSIONS  1. Left ventricular ejection fraction, by visual estimation, is 60 to 65%. The left ventricle has normal function. There is moderately increased left ventricular hypertrophy.  2. Elevated left ventricular end-diastolic pressure.  3. Left ventricular diastolic parameters are indeterminate.  4. The left ventricle has no regional wall motion abnormalities.  5. Global right ventricle has normal systolic function.The right ventricular size is normal. No increase in right ventricular wall thickness.  6. Left atrial size was normal.  7. Right atrial size was normal.  8. Severe mitral annular calcification.  9. The mitral valve is normal in structure. Trivial mitral valve regurgitation. 10. The tricuspid valve is normal in structure. 11. The tricuspid valve is normal in structure. Tricuspid valve regurgitation is trivial. 12. The aortic valve is tricuspid. Aortic valve regurgitation is not visualized. Mild aortic valve stenosis. 13. The pulmonic valve was not well visualized. Pulmonic valve regurgitation is not visualized. 14. TR signal is inadequate for assessing pulmonary artery systolic pressure. 15. The inferior vena cava is normal in size with <50% respiratory variability, suggesting right atrial pressure of 8 mmHg. 16. No prior Echocardiogram. FINDINGS  Left  Ventricle: Left ventricular ejection fraction, by visual estimation, is 60 to 65%. The left ventricle has normal function. The left ventricle has no regional wall motion abnormalities. There is moderately increased left ventricular hypertrophy. Concentric left ventricular hypertrophy. Left ventricular diastolic parameters are indeterminate. Elevated left ventricular end-diastolic pressure. Right Ventricle: The right ventricular  size is normal. No increase in right ventricular wall thickness. Global RV systolic function is has normal systolic function. Left Atrium: Left atrial size was normal in size. Right Atrium: Right atrial size was normal in size Pericardium: There is no evidence of pericardial effusion. Mitral Valve: The mitral valve is normal in structure. There is mild thickening of the mitral valve leaflet(s). There is mild calcification of the mitral valve leaflet(s). Severe mitral annular calcification. Trivial mitral valve regurgitation. Tricuspid Valve: The tricuspid valve is normal in structure. Tricuspid valve regurgitation is trivial. Aortic Valve: The aortic valve is tricuspid. . There is mild thickening and moderate calcification of the aortic valve. Aortic valve regurgitation is not visualized. Mild aortic stenosis is present. There is mild thickening of the aortic valve. There is moderate calcification of the aortic valve. Aortic valve mean gradient measures 8.0 mmHg. Aortic valve peak gradient measures 14.6 mmHg. Aortic valve area, by VTI measures 2.35 cm. Pulmonic Valve: The pulmonic valve was not well visualized. Pulmonic valve regurgitation is not visualized. Pulmonic regurgitation is not visualized. Aorta: The aortic root and ascending aorta are structurally normal, with no evidence of dilitation. Pulmonary Artery: The pulmonary artery is not well seen. Venous: The inferior vena cava is normal in size with less than 50% respiratory variability, suggesting right atrial pressure of 8 mmHg. IAS/Shunts: No atrial level shunt detected by color flow Doppler.  LEFT VENTRICLE PLAX 2D LVIDd:         4.92 cm  Diastology LVIDs:         3.18 cm  LV e' lateral:   4.57 cm/s LV PW:         1.41 cm  LV E/e' lateral: 24.7 LV IVS:        1.32 cm  LV e' medial:    3.05 cm/s LVOT diam:     1.90 cm  LV E/e' medial:  37.0 LV SV:         74 ml LV SV Index:   39.26 LVOT Area:     2.84 cm  RIGHT VENTRICLE RV Basal diam:  2.41 cm RV S  prime:     16.20 cm/s TAPSE (M-mode): 2.3 cm LEFT ATRIUM           Index       RIGHT ATRIUM          Index LA diam:      4.30 cm 2.40 cm/m  RA Area:     7.19 cm LA Vol (A2C): 20.3 ml 11.34 ml/m RA Volume:   8.94 ml  4.99 ml/m LA Vol (A4C): 35.5 ml 19.83 ml/m  AORTIC VALVE                    PULMONIC VALVE AV Area (Vmax):    2.39 cm     PV Vmax:       1.19 m/s AV Area (Vmean):   2.33 cm     PV Peak grad:  5.7 mmHg AV Area (VTI):     2.35 cm AV Vmax:           191.00 cm/s AV Vmean:  134.000 cm/s AV VTI:            0.361 m AV Peak Grad:      14.6 mmHg AV Mean Grad:      8.0 mmHg LVOT Vmax:         161.00 cm/s LVOT Vmean:        110.000 cm/s LVOT VTI:          0.299 m LVOT/AV VTI ratio: 0.83  AORTA Ao Root diam: 2.50 cm MITRAL VALVE MV Area (PHT): 2.29 cm              SHUNTS MV PHT:        95.99 msec            Systemic VTI:  0.30 m MV Decel Time: 331 msec              Systemic Diam: 1.90 cm MV E velocity: 113.00 cm/s 103 cm/s MV A velocity: 157.00 cm/s 70.3 cm/s MV E/A ratio:  0.72        1.5  Buford Dresser MD Electronically signed by Buford Dresser MD Signature Date/Time: 07/07/2019/2:58:25 PM    Final        ASSESSMENT AND PLAN    -Multidisciplinary rounds held today  Circulatory shock  -Due to ruptured infrarenal abdominal aortic aneurysm  -Vascular surgery is on case-appreciate input status post surgery- Had family meeting today - Dr Trula Slade present during discourse -continue Full MV support -continue Bronchodilator Therapy -Wean Fio2 and PEEP as tolerated -will perform SAT/SBT when respiratory parameters are met-plan to wean off MV today and re-intubate if needed after we recieved guidance from next of kin post family meeting today.    Ruptured infrarenal AAA  -Vascular surgery on case -status post aortic cuff placement  -Discussed case with vascular surgery today, patient remains with suboptimal urine output will continue with IV fluid resuscitation and continue  to wean vasopressor support  -Repeat chest x-ray 07/08/19 - Mild pulm edema.- we are diuresing now that patient is off of vasopressors ICU telemetry monitoring  ID -continue IV abx as prescibed -follow up cultures  GI/Nutrition GI PROPHYLAXIS as indicated DIET-->TF's as tolerated Constipation protocol as indicated  ENDO - ICU hypoglycemic\Hyperglycemia protocol -check FSBS per protocol   ELECTROLYTES -follow labs as needed -replace as needed -pharmacy consultation   DVT/GI PRX ordered -SCDs  TRANSFUSIONS AS NEEDED MONITOR FSBS ASSESS the need for LABS as needed   Critical care provider statement:    Critical care time (minutes):  109   Critical care time was exclusive of:  Separately billable procedures and treating other patients   Critical care was necessary to treat or prevent imminent or life-threatening deterioration of the following conditions:   Circulatory shock, abdominal aortic aneurysm rupture, multiple comorbid conditions   Critical care was time spent personally by me on the following activities:  Development of treatment plan with patient or surrogate, discussions with consultants, evaluation of patient's response to treatment, examination of patient, obtaining history from patient or surrogate, ordering and performing treatments and interventions, ordering and review of laboratory studies and re-evaluation of patient's condition.  I assumed direction of critical care for this patient from another provider in my specialty: no    This document was prepared using Dragon voice recognition software and may include unintentional dictation errors.    Ottie Glazier, M.D.  Division of [facility]

## 2019-07-09 ENCOUNTER — Inpatient Hospital Stay: Payer: Medicare Other

## 2019-07-09 ENCOUNTER — Encounter: Payer: Self-pay | Admitting: Cardiology

## 2019-07-09 LAB — BLOOD GAS, ARTERIAL
Acid-base deficit: 5 mmol/L — ABNORMAL HIGH (ref 0.0–2.0)
Bicarbonate: 22.4 mmol/L (ref 20.0–28.0)
FIO2: 0.7
MECHVT: 500 mL
O2 Saturation: 95.4 %
PEEP: 5 cmH2O
Patient temperature: 37
RATE: 20 resp/min
pCO2 arterial: 50 mmHg — ABNORMAL HIGH (ref 32.0–48.0)
pH, Arterial: 7.26 — ABNORMAL LOW (ref 7.350–7.450)
pO2, Arterial: 89 mmHg (ref 83.0–108.0)

## 2019-07-09 LAB — CBC
HCT: 35.6 % — ABNORMAL LOW (ref 36.0–46.0)
Hemoglobin: 11.1 g/dL — ABNORMAL LOW (ref 12.0–15.0)
MCH: 29.5 pg (ref 26.0–34.0)
MCHC: 31.2 g/dL (ref 30.0–36.0)
MCV: 94.7 fL (ref 80.0–100.0)
Platelets: 234 10*3/uL (ref 150–400)
RBC: 3.76 MIL/uL — ABNORMAL LOW (ref 3.87–5.11)
RDW: 13.5 % (ref 11.5–15.5)
WBC: 13 10*3/uL — ABNORMAL HIGH (ref 4.0–10.5)
nRBC: 0 % (ref 0.0–0.2)

## 2019-07-09 LAB — GLUCOSE, CAPILLARY
Glucose-Capillary: 138 mg/dL — ABNORMAL HIGH (ref 70–99)
Glucose-Capillary: 129 mg/dL — ABNORMAL HIGH (ref 70–99)
Glucose-Capillary: 110 mg/dL — ABNORMAL HIGH (ref 70–99)

## 2019-07-09 LAB — BASIC METABOLIC PANEL
Anion gap: 8 (ref 5–15)
BUN: 14 mg/dL (ref 8–23)
CO2: 24 mmol/L (ref 22–32)
Calcium: 8.7 mg/dL — ABNORMAL LOW (ref 8.9–10.3)
Chloride: 106 mmol/L (ref 98–111)
Creatinine, Ser: 0.76 mg/dL (ref 0.44–1.00)
GFR calc Af Amer: >60 mL/min (ref >60)
GFR calc non Af Amer: >60 mL/min (ref >60)
Glucose, Bld: 155 mg/dL — ABNORMAL HIGH (ref 70–99)
Potassium: 3.4 mmol/L — ABNORMAL LOW (ref 3.5–5.1)
Sodium: 138 mmol/L (ref 135–145)

## 2019-07-09 LAB — BRAIN NATRIURETIC PEPTIDE: B Natriuretic Peptide: 964 pg/mL — ABNORMAL HIGH (ref 0.0–100.0)

## 2019-07-09 LAB — MAGNESIUM: Magnesium: 1.7 mg/dL (ref 1.7–2.4)

## 2019-07-09 MED ORDER — DEXMEDETOMIDINE HCL IN NACL 400 MCG/100ML IV SOLN
0.0000 ug/kg/h | INTRAVENOUS | Status: DC
  Administered 2019-07-09: 0.8 ug/kg/h via INTRAVENOUS
  Administered 2019-07-09: 1.2 ug/kg/h via INTRAVENOUS
  Filled 2019-07-09 (×2): qty 100

## 2019-07-09 MED ORDER — FUROSEMIDE 10 MG/ML IJ SOLN
20.0000 mg | Freq: Once | INTRAMUSCULAR | Status: AC
  Administered 2019-07-09: 20 mg via INTRAVENOUS
  Filled 2019-07-09: qty 2

## 2019-07-09 MED ORDER — FENTANYL CITRATE (PF) 100 MCG/2ML IJ SOLN
INTRAMUSCULAR | Status: AC
  Administered 2019-07-09: 100 ug via INTRAVENOUS
  Filled 2019-07-09: qty 2

## 2019-07-09 MED ORDER — POTASSIUM CHLORIDE 10 MEQ/50ML IV SOLN
10.0000 meq | INTRAVENOUS | Status: DC
  Administered 2019-07-09 (×2): 10 meq via INTRAVENOUS
  Filled 2019-07-09 (×4): qty 50

## 2019-07-09 MED ORDER — ROCURONIUM BROMIDE 50 MG/5ML IV SOLN: 50.0000 mg | Freq: Once | INTRAVENOUS | Status: AC

## 2019-07-09 MED ORDER — MORPHINE SULFATE (PF) 2 MG/ML IV SOLN: 2.0000 mg | Freq: Once | INTRAVENOUS | Status: AC

## 2019-07-09 MED ORDER — ACETAMINOPHEN 650 MG RE SUPP: 650.0000 mg | Freq: Four times a day (QID) | RECTAL | Status: DC | PRN

## 2019-07-09 MED ORDER — SENNOSIDES-DOCUSATE SODIUM 8.6-50 MG PO TABS: 2.0000 | ORAL_TABLET | Freq: Two times a day (BID) | ORAL | Status: DC

## 2019-07-09 MED ORDER — ROCURONIUM BROMIDE 50 MG/5ML IV SOLN
INTRAVENOUS | Status: AC
  Administered 2019-07-09: 50 mg via INTRAVENOUS
  Filled 2019-07-09: qty 1

## 2019-07-09 MED ORDER — ONDANSETRON HCL 4 MG/2ML IJ SOLN: 4.0000 mg | Freq: Four times a day (QID) | INTRAMUSCULAR | Status: DC | PRN

## 2019-07-09 MED ORDER — LORAZEPAM 2 MG/ML IJ SOLN
2.0000 mg | INTRAMUSCULAR | Status: DC | PRN
  Administered 2019-07-09 (×3): 2 mg via INTRAVENOUS
  Filled 2019-07-09 (×3): qty 1

## 2019-07-09 MED ORDER — ETOMIDATE 2 MG/ML IV SOLN
INTRAVENOUS | Status: AC
  Administered 2019-07-09: 20 mg via INTRAVENOUS
  Filled 2019-07-09: qty 10

## 2019-07-09 MED ORDER — MORPHINE 100MG IN NS 100ML (1MG/ML) PREMIX INFUSION
1.0000 mg/h | INTRAVENOUS | Status: DC
  Administered 2019-07-09: 1 mg/h via INTRAVENOUS
  Filled 2019-07-09: qty 100

## 2019-07-09 MED ORDER — FENTANYL 2500MCG IN NS 250ML (10MCG/ML) PREMIX INFUSION
INTRAVENOUS | Status: AC
  Administered 2019-07-09: 200 ug/h via INTRAVENOUS
  Filled 2019-07-09: qty 250

## 2019-07-09 MED ORDER — IPRATROPIUM-ALBUTEROL 0.5-2.5 (3) MG/3ML IN SOLN: 3.0000 mL | RESPIRATORY_TRACT | Status: DC | PRN

## 2019-07-09 MED ORDER — FENTANYL CITRATE (PF) 100 MCG/2ML IJ SOLN: 100.0000 ug | Freq: Once | INTRAMUSCULAR | Status: AC

## 2019-07-09 MED ORDER — GLYCOPYRROLATE 0.2 MG/ML IJ SOLN
0.2000 mg | INTRAMUSCULAR | Status: DC | PRN
  Filled 2019-07-09: qty 1

## 2019-07-09 MED ORDER — GLYCOPYRROLATE 0.2 MG/ML IJ SOLN
0.2000 mg | INTRAMUSCULAR | Status: DC | PRN
  Filled 2019-07-09 (×3): qty 1

## 2019-07-09 MED ORDER — GLYCOPYRROLATE 0.2 MG/ML IJ SOLN
0.2000 mg | INTRAMUSCULAR | Status: DC | PRN
  Administered 2019-07-09 – 2019-07-10 (×3): 0.2 mg via INTRAVENOUS
  Filled 2019-07-09 (×3): qty 1

## 2019-07-09 MED ORDER — FENTANYL CITRATE (PF) 100 MCG/2ML IJ SOLN: 25.0000 ug | INTRAMUSCULAR | Status: DC | PRN

## 2019-07-09 MED ORDER — NOREPINEPHRINE 16 MG/250ML-% IV SOLN
0.0000 ug/min | INTRAVENOUS | Status: DC
  Administered 2019-07-09: 4 ug/min via INTRAVENOUS
  Filled 2019-07-09: qty 250

## 2019-07-09 MED ORDER — ONDANSETRON 4 MG PO TBDP
4.0000 mg | ORAL_TABLET | Freq: Four times a day (QID) | ORAL | Status: DC | PRN
  Filled 2019-07-09: qty 1

## 2019-07-09 MED ORDER — GLYCOPYRROLATE 1 MG PO TABS
1.0000 mg | ORAL_TABLET | ORAL | Status: DC | PRN
  Filled 2019-07-09: qty 1

## 2019-07-09 MED ORDER — ACETAMINOPHEN 325 MG PO TABS: 650.0000 mg | ORAL_TABLET | Freq: Four times a day (QID) | ORAL | Status: DC | PRN

## 2019-07-09 MED ORDER — FENTANYL 2500MCG IN NS 250ML (10MCG/ML) PREMIX INFUSION: 0.0000 ug/h | INTRAVENOUS | Status: DC

## 2019-07-09 MED ORDER — FAMOTIDINE 20 MG PO TABS: 20.0000 mg | ORAL_TABLET | Freq: Every day | ORAL | Status: DC

## 2019-07-09 MED ORDER — MORPHINE SULFATE (PF) 2 MG/ML IV SOLN
INTRAVENOUS | Status: AC
  Administered 2019-07-09: 2 mg via INTRAVENOUS
  Filled 2019-07-09: qty 1

## 2019-07-09 MED ORDER — ETOMIDATE 2 MG/ML IV SOLN: 20.0000 mg | Freq: Once | INTRAVENOUS | Status: AC

## 2019-07-09 NOTE — Progress Notes (Signed)
   07/09/19 0153  Clinical Encounter Type  Visited With Patient  Visit Type Initial  Referral From Nurse  Consult/Referral To Chaplain  Spiritual Encounters  Spiritual Needs Prayer;Emotional  [facility] received page at [place] to visit patient. Pt was awake upon arrival. Pt displays signs of SOB despite wearing O2 mask. Patient seemed anxious. Pt shared with Pryor Curia that she wanted to die. She had been sharing this with hospital staff for at least an hour. Pt's daughter is using aggressive measures to keep pt alive. This Chief Strategy Officer provided pastoral care by empathizing with pt and praying upon request. Pt seemed less agitated after prayer. Spoke with pt's RN and plan of care was to contact daughter and share with her recent requests of patient. Staff is hoping daughter will come bedside and hear mother's request herself.

## 2019-07-09 NOTE — Progress Notes (Signed)
Nutrition Brief Follow-Up Note  Chart reviewed. Patient now transitioning to comfort care.   No further nutrition interventions warranted at this time. Please re-consult RD as needed.   Paris Hohn King, MS, RD, LDN Pager number available on Amion   

## 2019-07-09 NOTE — Progress Notes (Signed)
[facility] Daily Progress Note   Subjective: 4 Days Post-Op: 1. US guidance for vascular access, bilateral femoral arteries 2. Catheter placement into aorta from bilateral femoral approachesand into the right renal artery from the left femoral approach 3. Placement of a28 mm proximal 14 cm lengthGore Excluder Endoprosthesis main body rightwith a 16 mm diameter by 12 cm length leftcontralateral limb 4. Placement of right iliac extension limb with a 14 mm diameter by 10 cm length limb 5. Placement of proximal aortic cuff x3 with 28 mm cuffs with the most proximal cuff being suprarenal 6. Right renal angiography 7. Placement of a right renal artery stent with a 6 mm diameter by 5 cm length Viabahn stent for a periscope maneuver combined with placing of a proximal aortic cuff proximal to the right renal artery origin 8. ProGlide closure devices bilateral femoral arteries  Re-intubated last night after extubation 12 hours earlier. Family chose comfort care this AM.  Marcelle Overlie PA-C 07/09/2019 1:16 PM

## 2019-07-09 NOTE — Procedures (Signed)
Intubation Procedure Note Brittany Parks [id number] 28-Nov-1930  Procedure: Intubation Indications: Respiratory insufficiency  Procedure Details Consent: Unable to obtain consent because of emergent medical necessity. Time Out: A timeout procedure was called, and correct patient, name, and ID confirmed.  Needed supplies and equipment were assembled and checked to include ET tube, 10 mL syringe, glidescope, Mac and Miller blades, suction, oxygen and bag mask valve, end-tidal CO2 monitor.  Performed  Hand washing was performed prior to starting the procedure.  Patient was positioned to align the mouth and pharynx to facilitate visualization of the glottis.  Heart rate, SPO2, and blood pressure was continuously monitored during the procedure preoxygenation was conducted prior to intubation and endotracheal tube was placed through the vocal cords into the trachea.  The artificial airway was placed under direct visualization via glidescope route using a 7.5 ET tube on the first attempt  ET tube was secured at the 23 cm mark   Maximum sterile technique was used including gloves, hand hygiene and mask.  4  Medications administered for RSI: 100 mcg Fentanyl IV, 20 mg Etomidate IV, 50 mg Rocuronium IV  Placement was confirmed by auscultation of lungs with good breath sounds bilaterally and no stomach sounds.  Condensation was noted in the endotracheal tube.  CO2 detector in place with appropriate color change, pulse ox 98%.  Evaluation Hemodynamic Status: BP stable throughout; O2 sats: stable throughout Patient's Current Condition: stable Complications: No apparent complications Patient did tolerate procedure well. Chest X-ray ordered to verify placement.  CXR: pending.       Darel Hong, AGACNP-BC [name] Pager: [phone]  [name] 07/09/2019

## 2019-07-09 NOTE — Progress Notes (Signed)
CXR verifies good positioning of ETT.  O2 saturations stable at 96%.  Called and updated pt's son Ashayla Latvala that pt tolerated intubation.      Darel Hong, AGACNP-BC [facility] Pager: [phone]

## 2019-07-09 NOTE — Progress Notes (Signed)
Over the past couple hours pt is having tachypnea, increased work of breathing, assessory muscle use, and extremely restless despite being on BiPAP.  Have attempted IV Lasix, IV morphine, and Nitroglycerin gtt to assist with BP in order to try and improve work of breathing.  Despite these measures she is struggling to breathe.  Called and spoke with pt's son Rayelyn Scheible to update him of her respiratory decline and need for intubation if they wish to continue aggressive care.  Nicki Reaper confirms that he would like his mother re-intubated. Will proceed with intubation.   Darel Hong, AGACNP-BC [facility] Pager: [phone]

## 2019-07-09 NOTE — Progress Notes (Signed)
11:15 - CH met w/ family (dtr. and son) in ICU waiting rm. as they awaited MD meeting to discuss comfort care.  Dtr shared pt. has Living Will, DNR, and did not want aggressive measures when admitted; yesterday medical team determined surgery needed and pt. was agreeable; this assent to surgery led family to alter plan of care from that expressed in Living Will; after being extubated, pt.'s breathing reportedly deteriorated to the point where reintubation was necessary; PA called pt.'s brother to get consent and br. agreed based on precedent of surgery.  This AM, RNs shared that pt. had 'begged' not to be reintubated last night; dtr. and son experiencing guilt for approving treatment that pt. apparently did not want.  Said they were not told of pt.'s resistance to reintubation.  MD discussed these concerns on arrival; explained that PA in charge would not have intubated pt. against her will and shared he felt the course of treatment approved by family was sensible.  Family made aware of pt.'s critical condition and that medical team had 'done everything we could for her'; family requested pt. be placed on comfort care.  Plan to extubate pt. at 12:30 and allow family to be w/pt. @ bedside.  12:30 - Pt. extubated; family not yet arrived; [place] entered rm. and prayed w/pt.  Family arrived shortly afterward and sat w/pt.  Pt. appeared aware of dtr. and son's presence.  [place] closed door for privacy and left family to spend time.  No further needs expressed.       07/09/19 1130  Clinical Encounter Type  Visited With Family;Patient;Patient and family together;Health care provider  Visit Type Initial;Psychological support;Spiritual support;Social support;Patient actively dying;Critical Care  Referral From Physician;Nurse  Recommendations  (evening chaplains alerted of comfort care situation)  Spiritual Encounters  Spiritual Needs Prayer;Emotional;Grief support

## 2019-07-09 NOTE — Progress Notes (Signed)
Family at bedside. Pt resting comfortably. No needs expressed at this time. Waiting on med-surg room to be ready for transfer.

## 2019-07-09 NOTE — Progress Notes (Signed)
Pt. Extubated to 2 lnc.

## 2019-07-09 NOTE — Progress Notes (Signed)
CRITICAL CARE PROGRESS NOTE    Name: Brittany Parks MRN: [id number] DOB: 1931/04/01     LOS: 4   SUBJECTIVE FINDINGS & SIGNIFICANT EVENTS   Patient description:  86 yo female admitted with ruptured infrarenal AAA s/p right renal artery angiography with placement of stent in the right renal artery and proximal aortic cuff with final cuff positioned in a suprarenal orientation remained mechanically intubated post    Lines / Drains: Fem TLC  Cultures / Sepsis markers: COVID19 negative MRSA pcr negative Infulenza negative Blood cx in process  Antibiotics: None    Protocols / Consultants: Vascular pccm hospitalist  Tests / Events: IVF challenge for fluid responsiveness  07/06/19-discussed case with daughter at bedside today 07/07/19 -attempting spontaneous breathing trial today to liberate patient from mechanical ventilation.  Care plan discussed with vascular surgery today.  Care plan discussed with daughter at bedside today. 07/08/19- Family conference - present is POA Peggy who is designated visito , son Artis Delay, and daughter Costella Hatcher -We discussed that patient is with advanced age actively smoking emphysema patient who survived AAA repair and is at high risk of re-intubation they discussed among themselves and wish to re-intubate if necessary. 07/09/19 -patient was re-intubated overnight due to worsening mental status overnight. Overnight we had family conference again.  Had family conference today and patients family does wish to move forward with comfort measures today.    PAST MEDICAL HISTORY   Past Medical History:  Diagnosis Date  . Abdominal aortic aneurysm (AAA) ([place])   . Adrenal mass, left ([place])   . COPD (chronic obstructive pulmonary disease) ([place])   . COPD (chronic obstructive  pulmonary disease) ([place])   . Emphysema lung ([place])   . Hyperlipidemia   . Hypertension   . Hypoxia      SURGICAL HISTORY   Past Surgical History:  Procedure Laterality Date  . ABDOMINAL AORTIC ENDOVASCULAR STENT GRAFT N/A 07/12/2019   Procedure: ABDOMINAL AORTIC ENDOVASCULAR STENT GRAFT with right renal stent placement, right femoral vein central line placement;  Surgeon: Katha Cabal, MD;  Location: [facility] ORS;  Service: Vascular;  Laterality: N/A;  . BLADDER SURGERY    . BLADDER TUMOR EXCISION    . ENDOVASCULAR REPAIR/STENT GRAFT N/A 07/06/2019   Procedure: ENDOVASCULAR REPAIR/STENT GRAFT;  Surgeon: Katha Cabal, MD;  Location: [place] [facility];  Service: Cardiovascular;  Laterality: N/A;     FAMILY HISTORY   Family History  Problem Relation Age of Onset  . Alzheimer's disease Mother   . Heart attack Father   . Aortic aneurysm Sister   . Cancer Brother   . Hepatomegaly Brother   . Alcohol abuse Brother      SOCIAL HISTORY   Social History   Tobacco Use  . Smoking status: Former Smoker    Packs/day: 1.00    Years: 70.00    Pack years: 70.00    Types: Cigarettes    Quit date: 07/2017    Years since quitting: 1.9  . Smokeless tobacco: Never Used  . Tobacco comment: smoking cessation materials not required  Substance Use Topics  . Alcohol use: No    Alcohol/week: 0.0 standard drinks  . Drug use: No     MEDICATIONS   Current Medication:  Current Facility-Administered Medications:  .  acetaminophen (TYLENOL) tablet 325-650 mg, 325-650 mg, Oral, Q4H PRN **OR** acetaminophen (TYLENOL) suppository 325-650 mg, 325-650 mg, Rectal, Q4H PRN, Schnier, Dolores Lory, MD .  chlorhexidine gluconate (MEDLINE KIT) (PERIDEX) 0.12 %  solution 15 mL, 15 mL, Mouth Rinse, BID, Schnier, Dolores Lory, MD, 15 mL at 07/08/19 2033 .  Chlorhexidine Gluconate Cloth 2 % PADS 6 each, 6 each, Topical, Daily, Schnier, Dolores Lory, MD, 6 each at 07/08/19 1749 .  dexmedetomidine (PRECEDEX)  400 MCG/100ML (4 mcg/mL) infusion, 0-1.2 mcg/kg/hr, Intravenous, Continuous, Darel Hong D, NP, Last Rate: 23.3 mL/hr at 07/09/19 0514, 1.2 mcg/kg/hr at 07/09/19 0514 .  docusate sodium (COLACE) capsule 100 mg, 100 mg, Oral, Daily, Schnier, Dolores Lory, MD .  famotidine (PEPCID) IVPB 20 mg premix, 20 mg, Intravenous, Q12H, Schnier, Dolores Lory, MD, Stopped at 07/08/19 2131 .  fentaNYL (SUBLIMAZE) injection 25 mcg, 25 mcg, Intravenous, Q15 min PRN, Darel Hong D, NP .  fentaNYL (SUBLIMAZE) injection 25-100 mcg, 25-100 mcg, Intravenous, Q30 min PRN, Darel Hong D, NP .  fentaNYL 2529mg in NS 2569m(1028mml) infusion-PREMIX, 0-400 mcg/hr, Intravenous, Continuous, KeeDarel Hong NP, Last Rate: 20 mL/hr at 07/09/19 0514, 200 mcg/hr at 07/09/19 0514 .  furosemide (LASIX) injection 40 mg, 40 mg, Intravenous, Daily, AleLanney Ginsuad, MD, 40 mg at 07/08/19 1232 .  hydrALAZINE (APRESOLINE) injection 5 mg, 5 mg, Intravenous, Q20 Min PRN, Schnier, GreDolores LoryD, 5 mg at 07/08/19 2347 .  insulin aspart (novoLOG) injection 0-15 Units, 0-15 Units, Subcutaneous, Q4H, BlaAwilda BillP, 2 Units at 07/09/19 0341 .  ipratropium-albuterol (DUONEB) 0.5-2.5 (3) MG/3ML nebulizer solution 3 mL, 3 mL, Nebulization, Q6H, AleLanney Ginsuad, MD, 3 mL at 07/09/19 0348 .  ipratropium-albuterol (DUONEB) 0.5-2.5 (3) MG/3ML nebulizer solution 3 mL, 3 mL, Nebulization, Q4H PRN, KeeDarel Hong NP .  labetalol (NORMODYNE) injection 10 mg, 10 mg, Intravenous, Q10 min PRN, Schnier, GreDolores LoryD .  magnesium sulfate IVPB 2 g 50 mL, 2 g, Intravenous, Daily PRN, Schnier, GreDolores LoryD .  MEDLINE mouth rinse, 15 mL, Mouth Rinse, 10 times per day, Schnier, GreDolores LoryD, 15 mL at 07/09/19 0500 .  metoprolol tartrate (LOPRESSOR) injection 2-5 mg, 2-5 mg, Intravenous, Q2H PRN, Schnier, GreDolores LoryD .  midazolam (VERSED) 2 MG/2ML injection, , , ,  .  nitroGLYCERIN 50 mg in dextrose 5 % 250 mL (0.2 mg/mL) infusion, 5-250  mcg/min, Intravenous, Titrated, Schnier, GreDolores LoryD, Stopped at 07/09/19 [phone] norepinephrine (LEVOPHED) 16 mg in 250m35memix infusion, 0-40 mcg/min, Intravenous, Titrated, KeenDarel HongNP, Last Rate: 3.75 mL/hr at 07/09/19 0514, 4 mcg/min at 07/09/19 0514 .  phenol (CHLORASEPTIC) mouth spray 1 spray, 1 spray, Mouth/Throat, PRN, Schnier, GregDolores Lory .  potassium chloride 10 mEq in 50 mL *CENTRAL LINE* IVPB, 10 mEq, Intravenous, Q1 Hr x 4, KeenDarel HongNP, Last Rate: 50 mL/hr at 07/09/19 0648, 10 mEq at 07/09/19 0648 .  potassium chloride SA (KLOR-CON) CR tablet 20-40 mEq, 20-40 mEq, Oral, Daily PRN, Schnier, GregDolores Lory .  sodium chloride flush (NS) 0.9 % injection 10-40 mL, 10-40 mL, Intracatheter, Q12H, KeenDarel HongNP, 10 mL at 07/08/19 2102 .  sodium chloride flush (NS) 0.9 % injection 10-40 mL, 10-40 mL, Intracatheter, PRN, KeenBradly Bienenstock    ALLERGIES   Other, Penicillins, Shellfish-derived products, Iodinated diagnostic agents, and Azithromycin    REVIEW OF SYSTEMS    Unable to obtain ROS due to mechanical ventilation  PHYSICAL EXAMINATION   Vital Signs: Temp:  [97.7 F (36.5 C)-99.1 F (37.3 C)] 97.9 F (36.6 C) (02/08 0415) Pulse Rate:  [51-135] 51 (02/08 0630) Resp:  [13-27] 24 (02/08 0630) BP: (48-202)/(30-100) 88/43 (02/08  0630) SpO2:  [87 %-100 %] 97 % (02/08 0630) FiO2 (%):  [30 %-70 %] 70 % (02/08 0348)  GENERAL: Age-appropriate HEAD: Normocephalic, atraumatic.  EYES: Pupils equal, round, reactive to light.  No scleral icterus.  MOUTH: Moist mucosal membrane. NECK: Supple. No thyromegaly. No nodules. No JVD.  PULMONARY: Mild bibasilar crackles without rhonchorous breath sounds CARDIOVASCULAR: S1 and S2. Regular rate and rhythm. No murmurs, rubs, or gallops.  GASTROINTESTINAL: Soft, nontender, non-distended. No masses. Positive bowel sounds. No hepatosplenomegaly.  MUSCULOSKELETAL: No swelling, clubbing, or edema.  NEUROLOGIC:  Intubated on mechanical ventilation with GCS 4 T SKIN:intact,warm,dry   PERTINENT DATA     Infusions: . dexmedetomidine (PRECEDEX) IV infusion 1.2 mcg/kg/hr (07/09/19 0514)  . famotidine (PEPCID) IV Stopped (07/08/19 2131)  . fentaNYL infusion INTRAVENOUS 200 mcg/hr (07/09/19 0514)  . magnesium sulfate bolus IVPB    . nitroGLYCERIN Stopped (07/09/19 0420)  . norepinephrine (LEVOPHED) Adult infusion 4 mcg/min (07/09/19 0514)  . potassium chloride 10 mEq (07/09/19 3299)   Scheduled Medications: . chlorhexidine gluconate (MEDLINE KIT)  15 mL Mouth Rinse BID  . Chlorhexidine Gluconate Cloth  6 each Topical Daily  . docusate sodium  100 mg Oral Daily  . furosemide  40 mg Intravenous Daily  . insulin aspart  0-15 Units Subcutaneous Q4H  . ipratropium-albuterol  3 mL Nebulization Q6H  . mouth rinse  15 mL Mouth Rinse 10 times per day  . midazolam      . sodium chloride flush  10-40 mL Intracatheter Q12H   PRN Medications: acetaminophen **OR** acetaminophen, fentaNYL (SUBLIMAZE) injection, fentaNYL (SUBLIMAZE) injection, hydrALAZINE, ipratropium-albuterol, labetalol, magnesium sulfate bolus IVPB, metoprolol tartrate, phenol, potassium chloride, sodium chloride flush Hemodynamic parameters:   Intake/Output: 02/07 0701 - 02/08 0700 In: 1366.1 [I.V.:1266.1; IV Piggyback:100] Out: 2426 [Urine:3750]  Ventilator  Settings: Vent Mode: PRVC FiO2 (%):  [30 %-70 %] 70 % Set Rate:  [15 bmp-24 bmp] 24 bmp Vt Set:  [400 mL-500 mL] 500 mL PEEP:  [5 cmH20] 5 cmH20 Pressure Support:  [5 cmH20] 5 cmH20     LAB RESULTS:  Basic Metabolic Panel: Recent Labs  Lab 07/27/2019 2204 07/12/2019 2204 07/06/19 0349 07/06/19 0349 07/06/19 1615 07/06/19 1615 07/07/19 0449 07/07/19 0449 07/08/19 0455 07/09/19 0433  NA 135   < > 136  --  138  --  137  --  138 138  K 4.2   < > 3.9   < > 3.8   < > 4.1   < > 3.9 3.4*  CL 99   < > 103  --  105  --  107  --  111 106  CO2 25   < > 27  --  25  --  22   --  21* 24  GLUCOSE 208*   < > 152*  --  89  --  95  --  96 155*  BUN 15   < > 14  --  17  --  17  --  15 14  CREATININE 0.73   < > 0.78  --  0.95  --  0.97  --  0.71 0.76  CALCIUM 8.6*   < > 8.3*  --  7.9*  --  8.0*  --  8.2* 8.7*  MG 1.6*  --   --   --   --   --  2.1  --   --   --   PHOS 4.0  --   --   --   --   --   --   --   --   --    < > =  values in this interval not displayed.   Liver Function Tests: Recent Labs  Lab 07/24/2019 1735  AST 13*  ALT 10  ALKPHOS 70  BILITOT 0.8  PROT 6.6  ALBUMIN 3.1*   No results for input(s): LIPASE, AMYLASE in the last 168 hours. No results for input(s): AMMONIA in the last 168 hours. CBC: Recent Labs  Lab 07/21/2019 1735 07/04/2019 1735 07/26/2019 2204 07/06/19 0349 07/07/19 0449 07/08/19 0455 07/09/19 0433  WBC 14.0*   < > 16.6* 16.5* 11.1* 9.2 13.0*  NEUTROABS 12.3*  --  15.5*  --   --   --   --   HGB 13.7   < > 12.9 12.1 10.7* 10.2* 11.1*  HCT 42.8   < > 40.5 37.1 34.1* 32.9* 35.6*  MCV 92.0   < > 92.0 91.4 94.7 95.9 94.7  PLT 360   < > 284 356 262 223 234   < > = values in this interval not displayed.   Cardiac Enzymes: No results for input(s): CKTOTAL, CKMB, CKMBINDEX, TROPONINI in the last 168 hours. BNP: Invalid input(s): POCBNP CBG: Recent Labs  Lab 07/08/19 1231 07/08/19 1525 07/08/19 2033 07/08/19 2337 07/09/19 0338  GLUCAP 112* 112* 131* 123* 138*     IMAGING RESULTS:  Imaging: DG Chest Port 1 View  Result Date: 07/09/2019 CLINICAL DATA:  Ventilator support EXAM: PORTABLE CHEST 1 VIEW COMPARISON:  07/08/2019 FINDINGS: Endotracheal tube tip is 5 cm above the carina. Patchy bilateral mid and lower lung pulmonary densities are similar or slightly improved. No worsening or new finding. IMPRESSION: Similar or slightly improved appearance of bilateral pulmonary density. Most consistent with resolving edema. Electronically Signed   By: Nelson Chimes M.D.   On: 07/09/2019 03:15   DG Chest Port 1 View  Result Date:  07/08/2019 CLINICAL DATA:  Intubation. EXAM: PORTABLE CHEST 1 VIEW COMPARISON:  July 08, 2019. FINDINGS: Endotracheal tube is in grossly good position. Stable cardiomegaly. Atherosclerosis of thoracic aorta is noted. Mild central pulmonary vascular congestion is noted with probable bilateral pulmonary edema. Small pleural effusions are noted. No pneumothorax is noted. Bony thorax is unremarkable. IMPRESSION: Endotracheal tube in grossly good position. Mild central pulmonary vascular congestion is noted with probable bilateral pulmonary edema. Aortic Atherosclerosis (ICD10-I70.0). Electronically Signed   By: Marijo Conception M.D.   On: 07/08/2019 10:58   DG Chest Port 1 View  Result Date: 07/08/2019 CLINICAL DATA:  Ventilator support EXAM: PORTABLE CHEST 1 VIEW COMPARISON:  07/07/2019 FINDINGS: Endotracheal tube tip is 6 cm above the carina. Mild cardiomegaly. Chronic aortic atherosclerosis. Mild interstitial edema persists, similar to the previous study. Volume loss may be slightly worsened in the lower lobes. IMPRESSION: Persistent mild pulmonary edema. Possible slight worsening of volume loss in the lower lobes. Electronically Signed   By: Nelson Chimes M.D.   On: 07/08/2019 00:57   ECHOCARDIOGRAM COMPLETE  Result Date: 07/07/2019   ECHOCARDIOGRAM REPORT   Patient Name:   DEANNAH ROSSI Date of Exam: 07/07/2019 Medical Rec #:  [id number]          Height:       62.0 in Accession #:    [id number]         Weight:       171.3 lb Date of Birth:  02/01/1931           BSA:          1.79 m Patient Age:    60 years  BP:           118/48 mmHg Patient Gender: F                  HR:           68 bpm. Exam Location:  [facility] Procedure: 2D Echo Indications:     CHF 428.31/ I50.31  History:         Patient has no prior history of Echocardiogram examinations.  Sonographer:     Arville Go RDCS Referring Phys:  [id number] Ottie Glazier Diagnosing Phys: Buford Dresser MD  Sonographer Comments: Technically  difficult study due to poor echo windows. Ventilator. IMPRESSIONS  1. Left ventricular ejection fraction, by visual estimation, is 60 to 65%. The left ventricle has normal function. There is moderately increased left ventricular hypertrophy.  2. Elevated left ventricular end-diastolic pressure.  3. Left ventricular diastolic parameters are indeterminate.  4. The left ventricle has no regional wall motion abnormalities.  5. Global right ventricle has normal systolic function.The right ventricular size is normal. No increase in right ventricular wall thickness.  6. Left atrial size was normal.  7. Right atrial size was normal.  8. Severe mitral annular calcification.  9. The mitral valve is normal in structure. Trivial mitral valve regurgitation. 10. The tricuspid valve is normal in structure. 11. The tricuspid valve is normal in structure. Tricuspid valve regurgitation is trivial. 12. The aortic valve is tricuspid. Aortic valve regurgitation is not visualized. Mild aortic valve stenosis. 13. The pulmonic valve was not well visualized. Pulmonic valve regurgitation is not visualized. 14. TR signal is inadequate for assessing pulmonary artery systolic pressure. 15. The inferior vena cava is normal in size with <50% respiratory variability, suggesting right atrial pressure of 8 mmHg. 16. No prior Echocardiogram. FINDINGS  Left Ventricle: Left ventricular ejection fraction, by visual estimation, is 60 to 65%. The left ventricle has normal function. The left ventricle has no regional wall motion abnormalities. There is moderately increased left ventricular hypertrophy. Concentric left ventricular hypertrophy. Left ventricular diastolic parameters are indeterminate. Elevated left ventricular end-diastolic pressure. Right Ventricle: The right ventricular size is normal. No increase in right ventricular wall thickness. Global RV systolic function is has normal systolic function. Left Atrium: Left atrial size was normal in  size. Right Atrium: Right atrial size was normal in size Pericardium: There is no evidence of pericardial effusion. Mitral Valve: The mitral valve is normal in structure. There is mild thickening of the mitral valve leaflet(s). There is mild calcification of the mitral valve leaflet(s). Severe mitral annular calcification. Trivial mitral valve regurgitation. Tricuspid Valve: The tricuspid valve is normal in structure. Tricuspid valve regurgitation is trivial. Aortic Valve: The aortic valve is tricuspid. . There is mild thickening and moderate calcification of the aortic valve. Aortic valve regurgitation is not visualized. Mild aortic stenosis is present. There is mild thickening of the aortic valve. There is moderate calcification of the aortic valve. Aortic valve mean gradient measures 8.0 mmHg. Aortic valve peak gradient measures 14.6 mmHg. Aortic valve area, by VTI measures 2.35 cm. Pulmonic Valve: The pulmonic valve was not well visualized. Pulmonic valve regurgitation is not visualized. Pulmonic regurgitation is not visualized. Aorta: The aortic root and ascending aorta are structurally normal, with no evidence of dilitation. Pulmonary Artery: The pulmonary artery is not well seen. Venous: The inferior vena cava is normal in size with less than 50% respiratory variability, suggesting right atrial pressure of 8 mmHg. IAS/Shunts: No atrial level shunt detected by color flow  Doppler.  LEFT VENTRICLE PLAX 2D LVIDd:         4.92 cm  Diastology LVIDs:         3.18 cm  LV e' lateral:   4.57 cm/s LV PW:         1.41 cm  LV E/e' lateral: 24.7 LV IVS:        1.32 cm  LV e' medial:    3.05 cm/s LVOT diam:     1.90 cm  LV E/e' medial:  37.0 LV SV:         74 ml LV SV Index:   39.26 LVOT Area:     2.84 cm  RIGHT VENTRICLE RV Basal diam:  2.41 cm RV S prime:     16.20 cm/s TAPSE (M-mode): 2.3 cm LEFT ATRIUM           Index       RIGHT ATRIUM          Index LA diam:      4.30 cm 2.40 cm/m  RA Area:     7.19 cm LA Vol  (A2C): 20.3 ml 11.34 ml/m RA Volume:   8.94 ml  4.99 ml/m LA Vol (A4C): 35.5 ml 19.83 ml/m  AORTIC VALVE                    PULMONIC VALVE AV Area (Vmax):    2.39 cm     PV Vmax:       1.19 m/s AV Area (Vmean):   2.33 cm     PV Peak grad:  5.7 mmHg AV Area (VTI):     2.35 cm AV Vmax:           191.00 cm/s AV Vmean:          134.000 cm/s AV VTI:            0.361 m AV Peak Grad:      14.6 mmHg AV Mean Grad:      8.0 mmHg LVOT Vmax:         161.00 cm/s LVOT Vmean:        110.000 cm/s LVOT VTI:          0.299 m LVOT/AV VTI ratio: 0.83  AORTA Ao Root diam: 2.50 cm MITRAL VALVE MV Area (PHT): 2.29 cm              SHUNTS MV PHT:        95.99 msec            Systemic VTI:  0.30 m MV Decel Time: 331 msec              Systemic Diam: 1.90 cm MV E velocity: 113.00 cm/s 103 cm/s MV A velocity: 157.00 cm/s 70.3 cm/s MV E/A ratio:  0.72        1.5  Buford Dresser MD Electronically signed by Buford Dresser MD Signature Date/Time: 07/07/2019/2:58:25 PM    Final        ASSESSMENT AND PLAN    -Multidisciplinary rounds held today  Circulatory shock  -Due to ruptured infrarenal abdominal aortic aneurysm  -Vascular surgery is on case-appreciate input status post surgery- Had family meeting today - Dr Trula Slade present during discourse -continue Full MV support -continue Bronchodilator Therapy -Wean Fio2 and PEEP as tolerated -patient did require re-intubation overnight and we had additional family meeting today with plan for comfort measures   Ruptured infrarenal AAA  -Vascular surgery on case -status post aortic cuff  placement  -Discussed case with vascular surgery today, patient remains with suboptimal urine output will continue with IV fluid resuscitation and continue to wean vasopressor support  -Repeat chest x-ray 07/08/19 - Mild pulm edema.- we are diuresing now that patient is off of vasopressors ICU telemetry monitoring  ID -continue IV abx as prescibed -follow up  cultures  GI/Nutrition GI PROPHYLAXIS as indicated DIET-->TF's as tolerated Constipation protocol as indicated  ENDO - ICU hypoglycemic\Hyperglycemia protocol -check FSBS per protocol   ELECTROLYTES -follow labs as needed -replace as needed -pharmacy consultation   DVT/GI PRX ordered -SCDs  TRANSFUSIONS AS NEEDED MONITOR FSBS ASSESS the need for LABS as needed   Critical care provider statement:    Critical care time (minutes):  109   Critical care time was exclusive of:  Separately billable procedures and treating other patients   Critical care was necessary to treat or prevent imminent or life-threatening deterioration of the following conditions:   Circulatory shock, abdominal aortic aneurysm rupture, multiple comorbid conditions   Critical care was time spent personally by me on the following activities:  Development of treatment plan with patient or surrogate, discussions with consultants, evaluation of patient's response to treatment, examination of patient, obtaining history from patient or surrogate, ordering and performing treatments and interventions, ordering and review of laboratory studies and re-evaluation of patient's condition.  I assumed direction of critical care for this patient from another provider in my specialty: no    This document was prepared using Dragon voice recognition software and may include unintentional dictation errors.    Ottie Glazier, M.D.  Division of [place]

## 2019-07-11 LAB — CULTURE, BLOOD (ROUTINE X 2)
Culture: NO GROWTH
Special Requests: ADEQUATE

## 2019-07-30 LAB — BLOOD GAS, ARTERIAL
Acid-Base Excess: 0.5 mmol/L (ref 0.0–2.0)
Bicarbonate: 27 mmol/L (ref 20.0–28.0)
FIO2: 0.4
MECHVT: 500 mL
O2 Saturation: 94.2 %
PEEP: 5 cmH2O
Patient temperature: 37
RATE: 20 resp/min
pCO2 arterial: 50 mmHg — ABNORMAL HIGH (ref 32.0–48.0)
pH, Arterial: 7.34 — ABNORMAL LOW (ref 7.350–7.450)
pO2, Arterial: 76 mmHg — ABNORMAL LOW (ref 83.0–108.0)

## 2019-07-30 NOTE — Progress Notes (Signed)
Morphine gtt lowered to 5mg /hr per family request r/t concerns of resp depression. Education about comfort care given. Pt resting comfortably at this time. Will cont to monitor.

## 2019-07-30 NOTE — Death Summary Note (Signed)
DEATH SUMMARY   Patient Details  Name: Brittany Parks MRN: [id number] DOB: 1930-10-24  Admission/Discharge Information   Admit Date:  July 15, 2019  Date of Death: Date of Death: 07/20/19  Time of Death: Time of Death: 0530  Length of Stay: 5  Referring Physician: Juline Patch, MD   Reason(s) for Hospitalization  Abdominal Pain   Diagnoses  Preliminary cause of death: Acute on chronic respiratory failure ([place]) Secondary Diagnoses (including complications and co-morbidities):  Active Problems:   Abdominal aortic aneurysm (AAA) (HCC)   COPD   Emphysema    Ruptured abdominal aortic aneurysm s/p right renal artery angiography with    placement of stent in the right renal artery   Hypovolemic shock secondary to ruptured AAA     Brief Hospital Course (including significant findings, care, treatment, and services provided and events leading to death)  Brittany Parks is a 86 y.o. year old female who presented to [facility] ER via EMS on 15-Jul-2019 with abdominal pain secondary to ruptured infrarenal AAA found on CTA Chest/Abd requiring emergent right renal artery angiography with placement of stent in the right renal artery and proximal aortic cuff with final cuff positioned in a suprarenal orientation.  She remained intubated postop due to acute hypercapnic hypoxic respiratory failure secondary to sedating medications and AECOPD requiring ICU admission.  Due to acute renal failure pt iv fluid resuscitated.  However, she later developed pulmonary edema requiring diuresis on 07/08/2019. ICU physician and Vascular Surgeon held family meeting with pts family to discuss if pt failed extubation would they want to proceed with reintubation.  Pts family stated they would want the pt reintubated if she failed extubation.  Pt extubated to [facility] on 07/08/2019.  On the morning of 07/09/2019 pts respiratory status declined requiring reintubation.  Following additional discussions with pts family pt transitioned  to comfort measures only on 07/09/2019 per family request.  Pt expired with family present at bedside on 07/20/2019 at 05:30 am.  Pertinent Labs and Studies  Significant Diagnostic Studies PERIPHERAL VASCULAR CATHETERIZATION  Result Date: 07/06/2019 See op note  DG Chest Port 1 View  Result Date: 07/09/2019 CLINICAL DATA:  Ventilator support EXAM: PORTABLE CHEST 1 VIEW COMPARISON:  07/08/2019 FINDINGS: Endotracheal tube tip is 5 cm above the carina. Patchy bilateral mid and lower lung pulmonary densities are similar or slightly improved. No worsening or new finding. IMPRESSION: Similar or slightly improved appearance of bilateral pulmonary density. Most consistent with resolving edema. Electronically Signed   By: Nelson Chimes M.D.   On: 07/09/2019 03:15   DG Chest Port 1 View  Result Date: 07/08/2019 CLINICAL DATA:  Intubation. EXAM: PORTABLE CHEST 1 VIEW COMPARISON:  July 08, 2019. FINDINGS: Endotracheal tube is in grossly good position. Stable cardiomegaly. Atherosclerosis of thoracic aorta is noted. Mild central pulmonary vascular congestion is noted with probable bilateral pulmonary edema. Small pleural effusions are noted. No pneumothorax is noted. Bony thorax is unremarkable. IMPRESSION: Endotracheal tube in grossly good position. Mild central pulmonary vascular congestion is noted with probable bilateral pulmonary edema. Aortic Atherosclerosis (ICD10-I70.0). Electronically Signed   By: Marijo Conception M.D.   On: 07/08/2019 10:58   DG Chest Port 1 View  Result Date: 07/08/2019 CLINICAL DATA:  Ventilator support EXAM: PORTABLE CHEST 1 VIEW COMPARISON:  07/07/2019 FINDINGS: Endotracheal tube tip is 6 cm above the carina. Mild cardiomegaly. Chronic aortic atherosclerosis. Mild interstitial edema persists, similar to the previous study. Volume loss may be slightly worsened in the lower lobes. IMPRESSION: Persistent  mild pulmonary edema. Possible slight worsening of volume loss in the lower lobes.  Electronically Signed   By: Nelson Chimes M.D.   On: 07/08/2019 00:57   DG Chest Port 1 View  Result Date: 07/07/2019 CLINICAL DATA:  Acute respiratory failure EXAM: PORTABLE CHEST 1 VIEW COMPARISON:  11/30/2011 FINDINGS: An endotracheal tube is identified with tip 6 cm above the carina. UPPER limits normal heart size with pulmonary vascular congestion noted. Mild interstitial opacities are suggestive of mild interstitial edema. No pleural effusion or pneumothorax. No acute bony abnormalities identified. IMPRESSION: 1. Endotracheal tube with tip 6 cm above the carina. 2. Pulmonary vascular congestion and probable mild interstitial edema. Electronically Signed   By: Margarette Canada M.D.   On: 07/07/2019 10:27   ECHOCARDIOGRAM COMPLETE  Result Date: 07/07/2019   ECHOCARDIOGRAM REPORT   Patient Name:   Brittany Parks Date of Exam: 07/07/2019 Medical Rec #:  [id number]          Height:       62.0 in Accession #:    [id number]         Weight:       171.3 lb Date of Birth:  [birth year]           BSA:          1.79 m Patient Age:    86 years           BP:           118/48 mmHg Patient Gender: F                  HR:           68 bpm. Exam Location:  [facility] Procedure: 2D Echo Indications:     CHF 428.31/ I50.31  History:         Patient has no prior history of Echocardiogram examinations.  Sonographer:     Arville Go RDCS Referring Phys:  [id number] Ottie Glazier Diagnosing Phys: Buford Dresser MD  Sonographer Comments: Technically difficult study due to poor echo windows. Ventilator. IMPRESSIONS  1. Left ventricular ejection fraction, by visual estimation, is 60 to 65%. The left ventricle has normal function. There is moderately increased left ventricular hypertrophy.  2. Elevated left ventricular end-diastolic pressure.  3. Left ventricular diastolic parameters are indeterminate.  4. The left ventricle has no regional wall motion abnormalities.  5. Global right ventricle has normal systolic function.The right  ventricular size is normal. No increase in right ventricular wall thickness.  6. Left atrial size was normal.  7. Right atrial size was normal.  8. Severe mitral annular calcification.  9. The mitral valve is normal in structure. Trivial mitral valve regurgitation. 10. The tricuspid valve is normal in structure. 11. The tricuspid valve is normal in structure. Tricuspid valve regurgitation is trivial. 12. The aortic valve is tricuspid. Aortic valve regurgitation is not visualized. Mild aortic valve stenosis. 13. The pulmonic valve was not well visualized. Pulmonic valve regurgitation is not visualized. 14. TR signal is inadequate for assessing pulmonary artery systolic pressure. 15. The inferior vena cava is normal in size with <50% respiratory variability, suggesting right atrial pressure of 8 mmHg. 16. No prior Echocardiogram. FINDINGS  Left Ventricle: Left ventricular ejection fraction, by visual estimation, is 60 to 65%. The left ventricle has normal function. The left ventricle has no regional wall motion abnormalities. There is moderately increased left ventricular hypertrophy. Concentric left ventricular hypertrophy. Left ventricular diastolic parameters are indeterminate. Elevated left ventricular end-diastolic  pressure. Right Ventricle: The right ventricular size is normal. No increase in right ventricular wall thickness. Global RV systolic function is has normal systolic function. Left Atrium: Left atrial size was normal in size. Right Atrium: Right atrial size was normal in size Pericardium: There is no evidence of pericardial effusion. Mitral Valve: The mitral valve is normal in structure. There is mild thickening of the mitral valve leaflet(s). There is mild calcification of the mitral valve leaflet(s). Severe mitral annular calcification. Trivial mitral valve regurgitation. Tricuspid Valve: The tricuspid valve is normal in structure. Tricuspid valve regurgitation is trivial. Aortic Valve: The aortic  valve is tricuspid. . There is mild thickening and moderate calcification of the aortic valve. Aortic valve regurgitation is not visualized. Mild aortic stenosis is present. There is mild thickening of the aortic valve. There is moderate calcification of the aortic valve. Aortic valve mean gradient measures 8.0 mmHg. Aortic valve peak gradient measures 14.6 mmHg. Aortic valve area, by VTI measures 2.35 cm. Pulmonic Valve: The pulmonic valve was not well visualized. Pulmonic valve regurgitation is not visualized. Pulmonic regurgitation is not visualized. Aorta: The aortic root and ascending aorta are structurally normal, with no evidence of dilitation. Pulmonary Artery: The pulmonary artery is not well seen. Venous: The inferior vena cava is normal in size with less than 50% respiratory variability, suggesting right atrial pressure of 8 mmHg. IAS/Shunts: No atrial level shunt detected by color flow Doppler.  LEFT VENTRICLE PLAX 2D LVIDd:         4.92 cm  Diastology LVIDs:         3.18 cm  LV e' lateral:   4.57 cm/s LV PW:         1.41 cm  LV E/e' lateral: 24.7 LV IVS:        1.32 cm  LV e' medial:    3.05 cm/s LVOT diam:     1.90 cm  LV E/e' medial:  37.0 LV SV:         74 ml LV SV Index:   39.26 LVOT Area:     2.84 cm  RIGHT VENTRICLE RV Basal diam:  2.41 cm RV S prime:     16.20 cm/s TAPSE (M-mode): 2.3 cm LEFT ATRIUM           Index       RIGHT ATRIUM          Index LA diam:      4.30 cm 2.40 cm/m  RA Area:     7.19 cm LA Vol (A2C): 20.3 ml 11.34 ml/m RA Volume:   8.94 ml  4.99 ml/m LA Vol (A4C): 35.5 ml 19.83 ml/m  AORTIC VALVE                    PULMONIC VALVE AV Area (Vmax):    2.39 cm     PV Vmax:       1.19 m/s AV Area (Vmean):   2.33 cm     PV Peak grad:  5.7 mmHg AV Area (VTI):     2.35 cm AV Vmax:           191.00 cm/s AV Vmean:          134.000 cm/s AV VTI:            0.361 m AV Peak Grad:      14.6 mmHg AV Mean Grad:      8.0 mmHg LVOT Vmax:         161.00  cm/s LVOT Vmean:        110.000 cm/s  LVOT VTI:          0.299 m LVOT/AV VTI ratio: 0.83  AORTA Ao Root diam: 2.50 cm MITRAL VALVE MV Area (PHT): 2.29 cm              SHUNTS MV PHT:        95.99 msec            Systemic VTI:  0.30 m MV Decel Time: 331 msec              Systemic Diam: 1.90 cm MV E velocity: 113.00 cm/s 103 cm/s MV A velocity: 157.00 cm/s 70.3 cm/s MV E/A ratio:  0.72        1.5  Buford Dresser MD Electronically signed by Buford Dresser MD Signature Date/Time: 07/07/2019/2:58:25 PM    Final    CT Angio Chest/Abd/Pel for Dissection W and/or Wo Contrast  Result Date: 07/16/2019 CLINICAL DATA:  86 year old female with abdominal aortic aneurysm. Operative planning. EXAM: CT ANGIOGRAPHY CHEST, ABDOMEN AND PELVIS TECHNIQUE: Multidetector CT imaging through the chest, abdomen and pelvis was performed using the standard protocol during bolus administration of intravenous contrast. Multiplanar reconstructed images and MIPs were obtained and reviewed to evaluate the vascular anatomy. CONTRAST:  84mL OMNIPAQUE IOHEXOL 350 MG/ML SOLN COMPARISON:  CT abdomen pelvis dated 03/06/2004. Chest CT dated 11/30/2011. FINDINGS: CTA CHEST FINDINGS Cardiovascular: There is no cardiomegaly or pericardial effusion. There is coronary vascular calcification of the RCA. There is advanced atherosclerotic calcification of the thoracic aorta. No aneurysmal dilatation or dissection. The origins of the great vessels of the aortic arch appear patent as visualized. There is mild dilatation of the central pulmonary arteries suggestive of a degree of pulmonary hypertension. Evaluation of the pulmonary vessels is limited due to suboptimal opacification and timing of the contrast. Mediastinum/Nodes: There is no hilar or mediastinal adenopathy. There is a small hiatal hernia. The esophagus and the thyroid gland are grossly unremarkable. No mediastinal fluid collection. Lungs/Pleura: There is moderate centrilobular emphysema. No focal consolidation, pleural  effusion, or pneumothorax. There is a 5 mm right lower lobe nodule (series 7, image 96). The central airways are patent. Musculoskeletal: Degenerative changes of the spine. No acute osseous pathology. Review of the MIP images confirms the above findings. CTA ABDOMEN AND PELVIS FINDINGS VASCULAR Aorta: There is advanced atherosclerotic calcification of the abdominal aorta. There is a partially thrombosed infrarenal abdominal aortic aneurysm. The aneurysm starts just below the takeoff of the renal arteries and extends through the entire length of the infrarenal aorta just above the aortic bifurcation. It measures approximately 11 cm in craniocaudal length (sagittal series 10, image 109). The maximal diameter of the aneurysm including the thrombosed lumen is approximately 5.7 cm (sagittal series 10, image 109). Several faint foci of high attenuation within the thrombosed portion of the aneurysm are concerning for active intramural bleed (series 6, images 1 20-122). The upper portion of the infrarenal aortic aneurysm measures approximately 3.8 cm in diameter (sagittal series 10, image 110). There is extraluminal high attenuating fluid adjacent to the distal aortic aneurysm consistent with blood and in keeping with aneurysm rupture. No definite extraluminal contrast identified. Celiac: There is atherosclerotic calcification of the origin of the celiac axis. The celiac artery may is patent. The left gastric artery appears to origin it directly from the aorta superior to the takeoff of the celiac artery (series 6, image 77). SMA: Patent without evidence of aneurysm, dissection,  vasculitis or significant stenosis. Renals: The renal arteries are patent. There is a small accessory renal artery on the right arising from the aorta just above the main renal artery and extending to the upper pole of the right kidney. There is a single dominant artery on the left. IMA: The origin of the IMA is not visualized with certainty.  Inflow: Advanced atherosclerotic calcification of the iliac arteries. The iliac arteries remain patent. The right common iliac artery measures 14 mm and the left common iliac artery measures 15 mm in diameter. Veins: No obvious venous abnormality within the limitations of this arterial phase study. Review of the MIP images confirms the above findings. NON-VASCULAR No intraabdominal free air. Hepatobiliary: The liver is unremarkable. No intrahepatic biliary ductal dilatation. There are multiple gallstones. No pericholecystic fluid or evidence of acute cholecystitis by CT. Pancreas: Unremarkable. No pancreatic ductal dilatation or surrounding inflammatory changes. Spleen: Normal in size without focal abnormality. Adrenals/Urinary Tract: Bilateral adrenal adenoma measuring up to 17 mm on the left. There is no hydronephrosis or nephrolithiasis on either side. Several small right renal cysts. The visualized ureters appear unremarkable. The urinary bladder appears slightly lobulated. Stomach/Bowel: There are several sigmoid diverticula without active inflammatory changes. There is no bowel obstruction or active inflammation. Diffusely thickened appearance of the colon likely related to underdistention. The appendix is not visualized with certainty. No inflammatory changes identified in the right lower quadrant. Lymphatic: No adenopathy. Reproductive: Small calcified uterine fibroid.  No adnexal masses. Other: None Musculoskeletal: Degenerative changes of the spine. No acute osseous pathology. Grade 1 L4-L5 anterolisthesis and multilevel lower lumbar facet arthropathy. Review of the MIP images confirms the above findings. IMPRESSION: 1. Infrarenal abdominal aortic aneurysm as described with evidence of rupture. Clinical correlation and vascular surgery consult is advised. 2. Emphysema and advanced atherosclerotic calcification of the aorta. Aortic Atherosclerosis (ICD10-I70.0) and Emphysema (ICD10-J43.9). 3.  Cholelithiasis. 4. Sigmoid diverticulosis. These results were called by telephone at the time of interpretation on 07/21/2019 at 6:15 pm to provider Merlyn Lot , who verbally acknowledged these results. Electronically Signed   By: Anner Crete M.D.   On: 07/02/2019 18:20    Microbiology Recent Results (from the past 240 hour(s))  Respiratory Panel by RT PCR (Flu A&B, Covid) - Nasopharyngeal Swab     Status: None   Collection Time: 07/27/2019  5:39 PM   Specimen: Nasopharyngeal Swab  Result Value Ref Range Status   SARS Coronavirus 2 by RT PCR NEGATIVE NEGATIVE Final    Comment: (NOTE) SARS-CoV-2 target nucleic acids are NOT DETECTED. The SARS-CoV-2 RNA is generally detectable in upper respiratoy specimens during the acute phase of infection. The lowest concentration of SARS-CoV-2 viral copies this assay can detect is 131 copies/mL. A negative result does not preclude SARS-Cov-2 infection and should not be used as the sole basis for treatment or other patient management decisions. A negative result may occur with  improper specimen collection/handling, submission of specimen other than nasopharyngeal swab, presence of viral mutation(s) within the areas targeted by this assay, and inadequate number of viral copies (<131 copies/mL). A negative result must be combined with clinical observations, patient history, and epidemiological information. The expected result is Negative. Fact Sheet for Patients:  PinkCheek.be Fact Sheet for Healthcare Providers:  GravelBags.it This test is not yet ap proved or cleared by the [place] FDA and  has been authorized for detection and/or diagnosis of SARS-CoV-2 by FDA under an Emergency Use Authorization (EUA). This EUA will remain  in effect (  meaning this test can be used) for the duration of the COVID-19 declaration under Section 564(b)(1) of the Act, 21 U.S.C. section 360bbb-3(b)(1),  unless the authorization is terminated or revoked sooner.    Influenza A by PCR NEGATIVE NEGATIVE Final   Influenza B by PCR NEGATIVE NEGATIVE Final    Comment: (NOTE) The Xpert Xpress SARS-CoV-2/FLU/RSV assay is intended as an aid in  the diagnosis of influenza from Nasopharyngeal swab specimens and  should not be used as a sole basis for treatment. Nasal washings and  aspirates are unacceptable for Xpert Xpress SARS-CoV-2/FLU/RSV  testing. Fact Sheet for Patients: PinkCheek.be Fact Sheet for Healthcare Providers: GravelBags.it This test is not yet approved or cleared by the [place] FDA and  has been authorized for detection and/or diagnosis of SARS-CoV-2 by  FDA under an Emergency Use Authorization (EUA). This EUA will remain  in effect (meaning this test can be used) for the duration of the  Covid-19 declaration under Section 564(b)(1) of the Act, 21  U.S.C. section 360bbb-3(b)(1), unless the authorization is  terminated or revoked. Performed at [facility], [address] [place], [place] [address]   MRSA PCR Screening     Status: None   Collection Time: 07/11/2019 10:04 PM   Specimen: Nasopharyngeal  Result Value Ref Range Status   MRSA by PCR NEGATIVE NEGATIVE Final    Comment:        The GeneXpert MRSA Assay (FDA approved for NASAL specimens only), is one component of a comprehensive MRSA colonization surveillance program. It is not intended to diagnose MRSA infection nor to guide or monitor treatment for MRSA infections. Performed at [facility], [address] [place], [place] [address]   CULTURE, BLOOD (ROUTINE X 2) w Reflex to ID Panel     Status: None (Preliminary result)   Collection Time: 07/06/19  5:01 PM   Specimen: BLOOD  Result Value Ref Range Status   Specimen Description BLOOD LEFT ARM  Final   Special Requests   Final    BOTTLES DRAWN AEROBIC AND ANAEROBIC Blood  Culture adequate volume   Culture   Final    NO GROWTH 3 DAYS Performed at [facility], [address] [place], [place] [address]    Report Status PENDING  Incomplete    Lab Basic Metabolic Panel: Recent Labs  Lab 07/29/2019 2204 07/14/2019 2204 07/06/19 0349 07/06/19 1615 07/07/19 0449 07/08/19 0455 07/09/19 0433  NA 135   < > 136 138 137 138 138  K 4.2   < > 3.9 3.8 4.1 3.9 3.4*  CL 99   < > 103 105 107 111 106  CO2 25   < > 27 25 22  21* 24  GLUCOSE 208*   < > 152* 89 95 96 155*  BUN 15   < > 14 17 17 15 14   CREATININE 0.73   < > 0.78 0.95 0.97 0.71 0.76  CALCIUM 8.6*   < > 8.3* 7.9* 8.0* 8.2* 8.7*  MG 1.6*  --   --   --  2.1  --  1.7  PHOS 4.0  --   --   --   --   --   --    < > = values in this interval not displayed.   Liver Function Tests: Recent Labs  Lab 07/25/2019 1735  AST 13*  ALT 10  ALKPHOS 70  BILITOT 0.8  PROT 6.6  ALBUMIN 3.1*   No results for input(s): LIPASE, AMYLASE  in the last 168 hours. No results for input(s): AMMONIA in the last 168 hours. CBC: Recent Labs  Lab 07/11/2019 1735 07/17/2019 1735 07/09/2019 2204 07/06/19 0349 07/07/19 0449 07/08/19 0455 07/09/19 0433  WBC 14.0*   < > 16.6* 16.5* 11.1* 9.2 13.0*  NEUTROABS 12.3*  --  15.5*  --   --   --   --   HGB 13.7   < > 12.9 12.1 10.7* 10.2* 11.1*  HCT 42.8   < > 40.5 37.1 34.1* 32.9* 35.6*  MCV 92.0   < > 92.0 91.4 94.7 95.9 94.7  PLT 360   < > 284 356 262 223 234   < > = values in this interval not displayed.   Cardiac Enzymes: No results for input(s): CKTOTAL, CKMB, CKMBINDEX, TROPONINI in the last 168 hours. Sepsis Labs: Recent Labs  Lab 07/15/2019 2204 07/06/19 0001 07/06/19 0349 07/06/19 1507 07/07/19 0449 07/08/19 0455 07/09/19 0433  PROCALCITON   < >  --  <0.10 <0.10 <0.10 <0.10  --   WBC   < >  --  16.5*  --  11.1* 9.2 13.0*  LATICACIDVEN  --  2.6*  --   --   --   --   --    < > = values in this interval not displayed.    Procedures/Operations  -Right renal  artery angiography with placement of stent in the right renal artery and proximal aortic cuff with final cuff positioned in a suprarenal orientation -Right femoral central line placement  -Mechanical Intubation x2  Marda Stalker, [place] Pager [phone] (please enter 7 digits) PCCM Consult Pager [phone] (please enter 7 digits)

## 2019-07-30 NOTE — Progress Notes (Signed)
Pt expired peacefully with family member scott at bedside. Time of death pronounced at 75. Nicki Reaper states he is going to reach out to family members so they can come in for viewing. Nicki Reaper states they have funeral arrangements at [facility] in [place]. [facility] paged and stated that pt is not a candidate at this time d/t age.

## 2019-07-30 DEATH — deceased

## 2019-11-05 ENCOUNTER — Ambulatory Visit: Payer: Medicare Other | Admitting: Family Medicine

## 2019-12-31 ENCOUNTER — Ambulatory Visit: Payer: Medicare Other

## 2021-04-07 IMAGING — CT CT ANGIO CHEST-ABD-PELV FOR DISSECTION W/ AND WO/W CM
2 of 7 series · 12 of 46 positions shown, 14 images · IV contrast (omnipaque)
Comparison: CT abdomen pelvis dated 03/06/2004. Chest CT dated
11/30/2011.

CLINICAL DATA: 88-year-old female with abdominal aortic aneurysm.
Operative planning.

EXAM:
CT ANGIOGRAPHY CHEST, ABDOMEN AND PELVIS
TECHNIQUE: Multidetector CT imaging through the chest, abdomen and pelvis was
performed using the standard protocol during bolus administration of
intravenous contrast. Multiplanar reconstructed images and MIPs were
obtained and reviewed to evaluate the vascular anatomy.
CONTRAST:  75mL OMNIPAQUE IOHEXOL 350 MG/ML SOLN

[Series 6: axial arterial · axial · arterial · 0.71mm/px · z∈[-598,-91]mm · 9 of 196 slices shown, 11 images]
[im 14/196  soft-tissue]
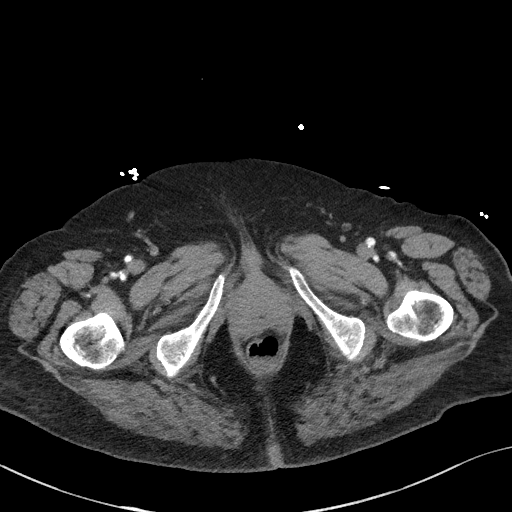
[im 14/196  bone]
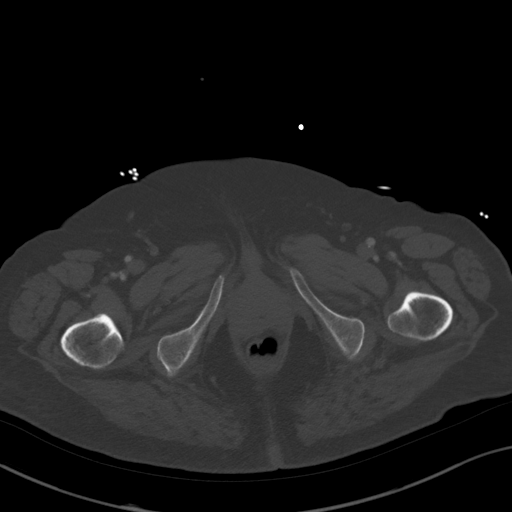
[im 40/196  soft-tissue]
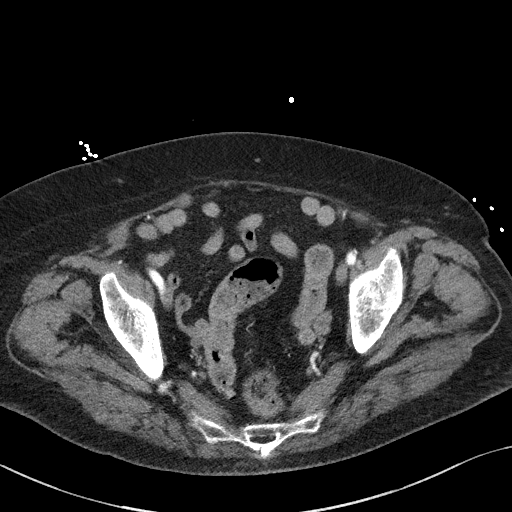
[im 53/196  soft-tissue]
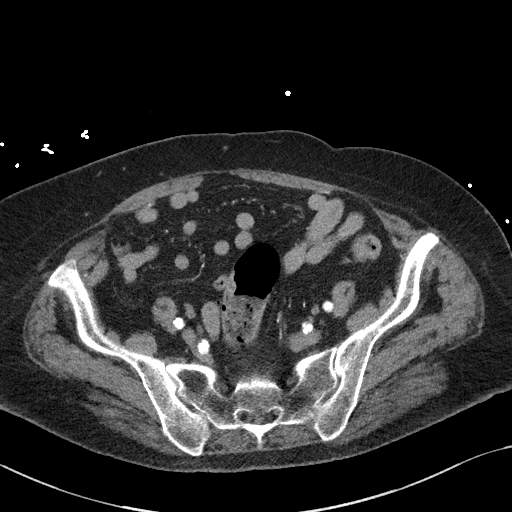
[im 79/196  soft-tissue]
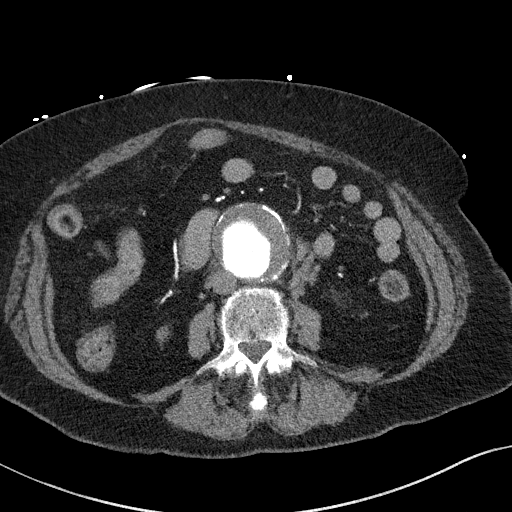
[im 105/196  soft-tissue]
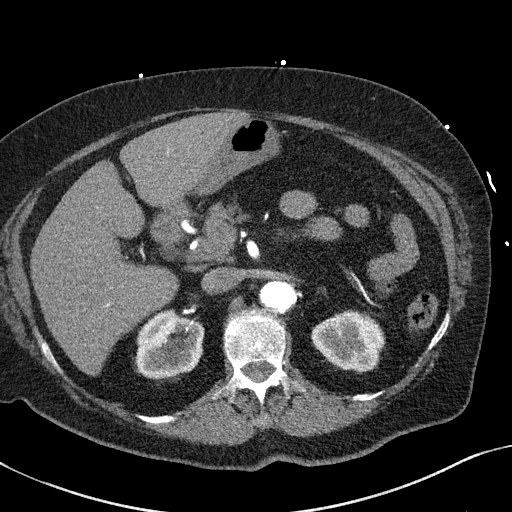
[im 118/196  soft-tissue]
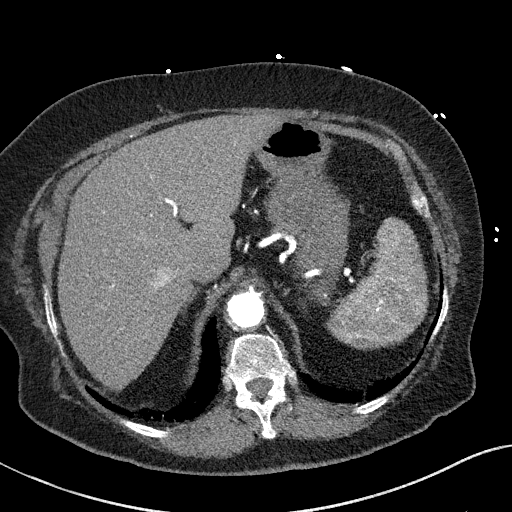
[im 144/196  soft-tissue]
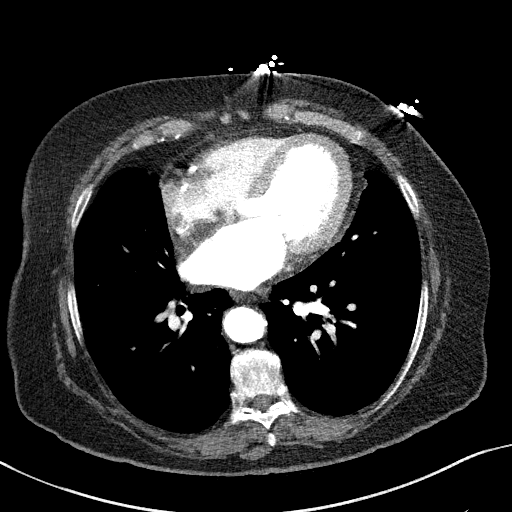
[im 157/196  soft-tissue]
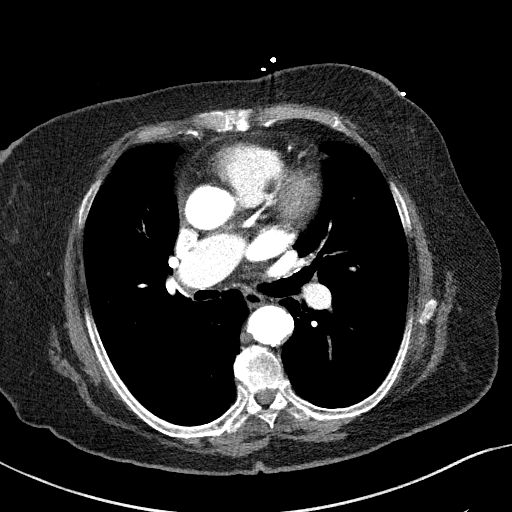
[im 183/196  soft-tissue]
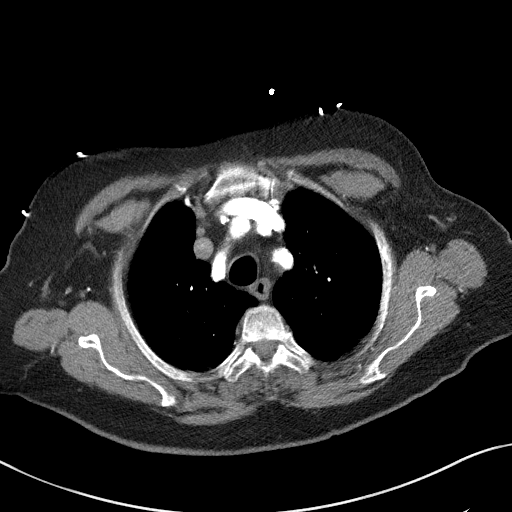
[im 183/196  bone]
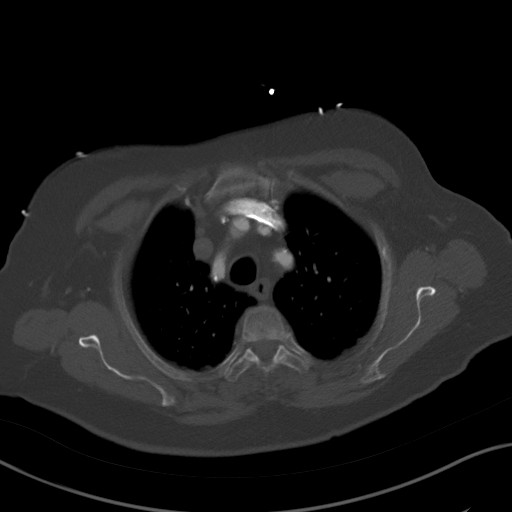

[Series 9: coronals · coronal · 0.82mm/px · 3 of 151 slices shown]
[im 38/151  soft-tissue]
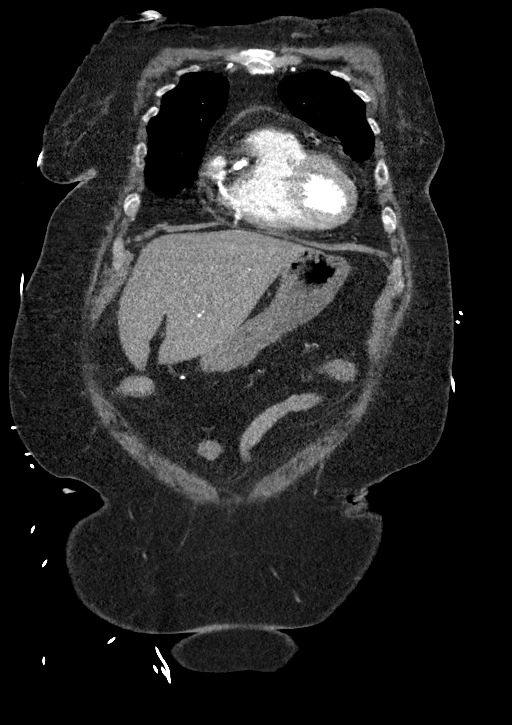
[im 76/151  soft-tissue]
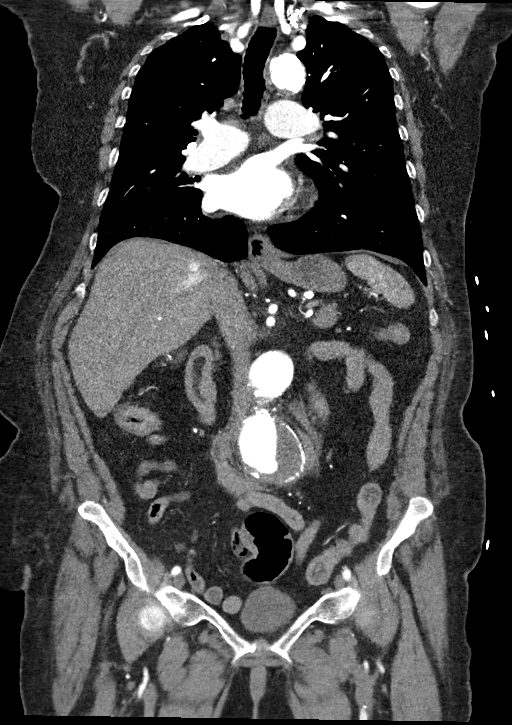
[im 113/151  soft-tissue]
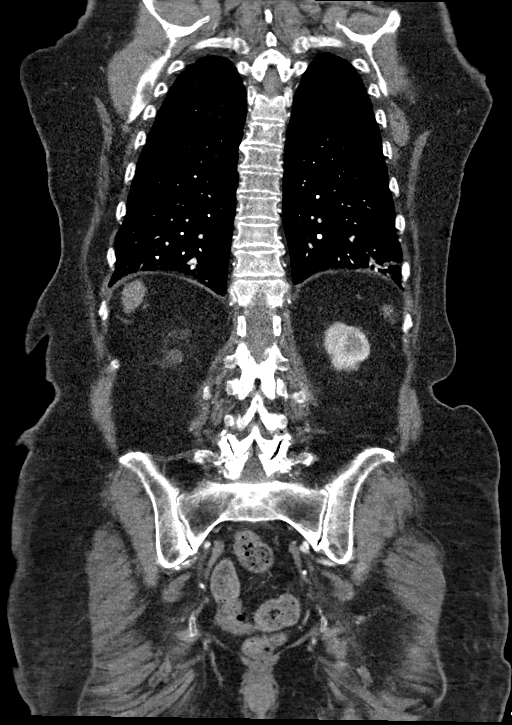

[12 of 46 positions shown; findings below may reference images not displayed]

FINDINGS: CTA CHEST FINDINGS

Cardiovascular: There is no cardiomegaly or pericardial effusion.
There is coronary vascular calcification of the RCA. There is
advanced atherosclerotic calcification of the thoracic aorta. No
aneurysmal dilatation or dissection. The origins of the great
vessels of the aortic arch appear patent as visualized. There is
mild dilatation of the central pulmonary arteries suggestive of a
degree of pulmonary hypertension. Evaluation of the pulmonary
vessels is limited due to suboptimal opacification and timing of the
contrast.

Mediastinum/Nodes: There is no hilar or mediastinal adenopathy.
There is a small hiatal hernia. The esophagus and the thyroid gland
are grossly unremarkable. No mediastinal fluid collection.

Lungs/Pleura: There is moderate centrilobular emphysema. No focal
consolidation, pleural effusion, or pneumothorax. There is a 5 mm
right lower lobe nodule (series 7, image 96). The central airways
are patent.

Musculoskeletal: Degenerative changes of the spine. No acute osseous
pathology.

Review of the MIP images confirms the above findings.

CTA ABDOMEN AND PELVIS FINDINGS

VASCULAR

Aorta: There is advanced atherosclerotic calcification of the
abdominal aorta. There is a partially thrombosed infrarenal
abdominal aortic aneurysm. The aneurysm starts just below the
takeoff of the renal arteries and extends through the entire length
of the infrarenal aorta just above the aortic bifurcation. It
measures approximately 11 cm in craniocaudal length (sagittal series
10, image 109). The maximal diameter of the aneurysm including the
thrombosed lumen is approximately 5.7 cm (sagittal series 10, image
109).

Several faint foci of high attenuation within the thrombosed portion
of the aneurysm are concerning for active intramural bleed (series
6, images 1 20-122).

The upper portion of the infrarenal aortic aneurysm measures
approximately 3.8 cm in diameter (sagittal series 10, image 110).

There is extraluminal high attenuating fluid adjacent to the distal
aortic aneurysm consistent with blood and in keeping with aneurysm
rupture. No definite extraluminal contrast identified.

Celiac: There is atherosclerotic calcification of the origin of the
celiac axis. The celiac artery may is patent. The left gastric
artery appears to origin it directly from the aorta superior to the
takeoff of the celiac artery (series 6, image 77).

SMA: Patent without evidence of aneurysm, dissection, vasculitis or
significant stenosis.

Renals: The renal arteries are patent. There is a small accessory
renal artery on the right arising from the aorta just above the main
renal artery and extending to the upper pole of the right kidney.
There is a single dominant artery on the left.

IMA: The origin of the IMA is not visualized with certainty.

Inflow: Advanced atherosclerotic calcification of the iliac
arteries. The iliac arteries remain patent. The right common iliac
artery measures 14 mm and the left common iliac artery measures 15
mm in diameter.

Veins: No obvious venous abnormality within the limitations of this
arterial phase study.

Review of the MIP images confirms the above findings.

NON-VASCULAR

No intraabdominal free air.

Hepatobiliary: The liver is unremarkable. No intrahepatic biliary
ductal dilatation. There are multiple gallstones. No pericholecystic
fluid or evidence of acute cholecystitis by CT.

Pancreas: Unremarkable. No pancreatic ductal dilatation or
surrounding inflammatory changes.

Spleen: Normal in size without focal abnormality.

Adrenals/Urinary Tract: Bilateral adrenal adenoma measuring up to 17
mm on the left. There is no hydronephrosis or nephrolithiasis on
either side. Several small right renal cysts. The visualized ureters
appear unremarkable. The urinary bladder appears slightly lobulated.

Stomach/Bowel: There are several sigmoid diverticula without active
inflammatory changes. There is no bowel obstruction or active
inflammation. Diffusely thickened appearance of the colon likely
related to underdistention. The appendix is not visualized with
certainty. No inflammatory changes identified in the right lower
quadrant.

Lymphatic: No adenopathy.

Reproductive: Small calcified uterine fibroid.  No adnexal masses.

Other: None

Musculoskeletal: Degenerative changes of the spine. No acute osseous
pathology. Grade 1 L4-L5 anterolisthesis and multilevel lower lumbar
facet arthropathy.

Review of the MIP images confirms the above findings.
IMPRESSION: 1. Infrarenal abdominal aortic aneurysm as described with evidence
of rupture. Clinical correlation and vascular surgery consult is
advised.
2. Emphysema and advanced atherosclerotic calcification of the
aorta. Aortic Atherosclerosis (7755F-R15.5) and Emphysema
([id number].F).
3. Cholelithiasis.
4. Sigmoid diverticulosis.

These results were called by telephone at the time of interpretation
on 07/05/2019 at [DATE] to provider HAILEE THEISS , who verbally
acknowledged these results.
